# Patient Record
Sex: Male | Born: 1935 | Race: White | Hispanic: No | Smoking: Former smoker
Health system: Southern US, Community
[De-identification: ages and names within clinical notes are randomized; demographics above are authoritative.]

## PROBLEM LIST (undated history)

## (undated) DIAGNOSIS — E785 Hyperlipidemia, unspecified: Secondary | ICD-10-CM

## (undated) DIAGNOSIS — C61 Malignant neoplasm of prostate: Secondary | ICD-10-CM

## (undated) DIAGNOSIS — I714 Abdominal aortic aneurysm, without rupture, unspecified: Secondary | ICD-10-CM

## (undated) DIAGNOSIS — J449 Chronic obstructive pulmonary disease, unspecified: Secondary | ICD-10-CM

## (undated) DIAGNOSIS — I10 Essential (primary) hypertension: Secondary | ICD-10-CM

## (undated) HISTORY — DX: Hyperlipidemia, unspecified: E78.5

## (undated) HISTORY — DX: Essential (primary) hypertension: I10

## (undated) HISTORY — PX: TRANSURETHRAL RESECTION OF PROSTATE: SHX73

## (undated) HISTORY — DX: Abdominal aortic aneurysm, without rupture, unspecified: I71.40

## (undated) HISTORY — DX: Chronic obstructive pulmonary disease, unspecified: J44.9

## (undated) HISTORY — DX: Abdominal aortic aneurysm, without rupture: I71.4

---

## 1969-05-29 HISTORY — PX: STOMACH SURGERY: SHX791

## 1997-12-07 ENCOUNTER — Other Ambulatory Visit: Admission: RE | Admit: 1997-12-07 | Discharge: 1997-12-07 | Payer: Self-pay | Admitting: Family Medicine

## 2000-01-09 ENCOUNTER — Encounter: Payer: Self-pay | Admitting: Neurology

## 2000-01-09 ENCOUNTER — Encounter: Payer: Self-pay | Admitting: Emergency Medicine

## 2000-01-09 ENCOUNTER — Inpatient Hospital Stay (HOSPITAL_COMMUNITY): Admission: EM | Admit: 2000-01-09 | Discharge: 2000-01-11 | Payer: Self-pay | Admitting: Emergency Medicine

## 2000-11-06 ENCOUNTER — Encounter: Payer: Self-pay | Admitting: Gastroenterology

## 2000-11-06 ENCOUNTER — Encounter: Admission: RE | Admit: 2000-11-06 | Discharge: 2000-11-06 | Payer: Self-pay | Admitting: Gastroenterology

## 2000-12-04 ENCOUNTER — Ambulatory Visit (HOSPITAL_COMMUNITY): Admission: RE | Admit: 2000-12-04 | Discharge: 2000-12-04 | Payer: Self-pay | Admitting: Gastroenterology

## 2001-09-01 ENCOUNTER — Observation Stay (HOSPITAL_COMMUNITY): Admission: EM | Admit: 2001-09-01 | Discharge: 2001-09-02 | Payer: Self-pay | Admitting: Emergency Medicine

## 2001-09-01 ENCOUNTER — Encounter: Payer: Self-pay | Admitting: Emergency Medicine

## 2003-10-04 ENCOUNTER — Emergency Department (HOSPITAL_COMMUNITY): Admission: EM | Admit: 2003-10-04 | Discharge: 2003-10-05 | Payer: Self-pay | Admitting: Emergency Medicine

## 2004-01-11 ENCOUNTER — Encounter (INDEPENDENT_AMBULATORY_CARE_PROVIDER_SITE_OTHER): Payer: Self-pay | Admitting: Specialist

## 2004-01-11 ENCOUNTER — Ambulatory Visit (HOSPITAL_COMMUNITY): Admission: RE | Admit: 2004-01-11 | Discharge: 2004-01-11 | Payer: Self-pay | Admitting: Urology

## 2004-01-11 ENCOUNTER — Ambulatory Visit (HOSPITAL_BASED_OUTPATIENT_CLINIC_OR_DEPARTMENT_OTHER): Admission: RE | Admit: 2004-01-11 | Discharge: 2004-01-11 | Payer: Self-pay | Admitting: Urology

## 2004-03-01 ENCOUNTER — Ambulatory Visit (HOSPITAL_COMMUNITY): Admission: RE | Admit: 2004-03-01 | Discharge: 2004-03-01 | Payer: Self-pay | Admitting: General Surgery

## 2004-03-14 ENCOUNTER — Encounter (INDEPENDENT_AMBULATORY_CARE_PROVIDER_SITE_OTHER): Payer: Self-pay | Admitting: Specialist

## 2004-03-14 ENCOUNTER — Ambulatory Visit (HOSPITAL_COMMUNITY): Admission: RE | Admit: 2004-03-14 | Discharge: 2004-03-14 | Payer: Self-pay | Admitting: General Surgery

## 2004-03-16 ENCOUNTER — Encounter: Admission: RE | Admit: 2004-03-16 | Discharge: 2004-03-16 | Payer: Self-pay | Admitting: General Surgery

## 2004-08-29 ENCOUNTER — Emergency Department (HOSPITAL_COMMUNITY): Admission: EM | Admit: 2004-08-29 | Discharge: 2004-08-29 | Payer: Self-pay | Admitting: Emergency Medicine

## 2004-09-07 ENCOUNTER — Encounter: Admission: RE | Admit: 2004-09-07 | Discharge: 2004-09-07 | Payer: Self-pay | Admitting: Gastroenterology

## 2005-04-18 ENCOUNTER — Ambulatory Visit (HOSPITAL_COMMUNITY): Admission: RE | Admit: 2005-04-18 | Discharge: 2005-04-18 | Payer: Self-pay | Admitting: General Surgery

## 2005-06-21 IMAGING — US US BIOPSY
1 series · 13 of 14 positions shown · non-contrast
Comparison: none

CLINICAL DATA: Patient with history of hypercalcemia and thyroid ultrasound at [HOSPITAL] on 03/01/04 which revealed a multinodular thyroid gland with a 2.3 x 1.2 x 1.0 cm inhomogeneous solid nodule in the left mid thyroid lobe.  Request is made for fine-needle aspiration of this dominant left thyroid lobe nodule.

ULTRASOUND-GUIDED FINE NEEDLE ASPIRATION OF DOMINANT LEFT THYROID LOBE NODULE

[Series 1: unknown · 0.09mm/px · 13 of 14 slices shown]
[im 1/14]
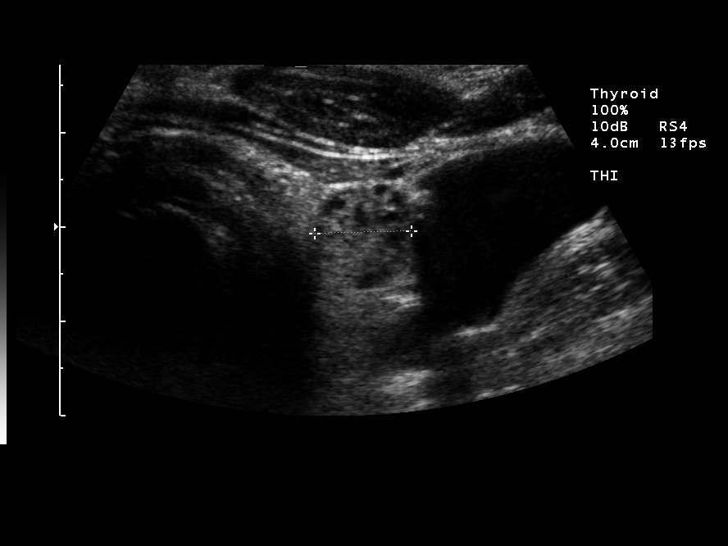
[im 2/14]
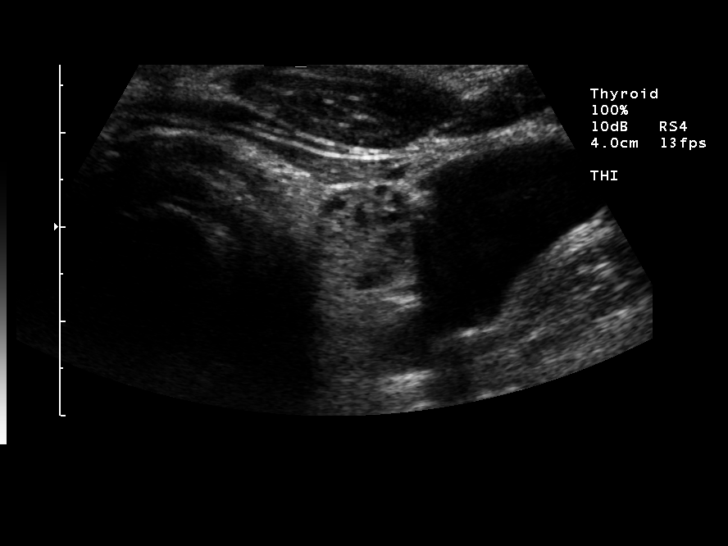
[im 3/14]
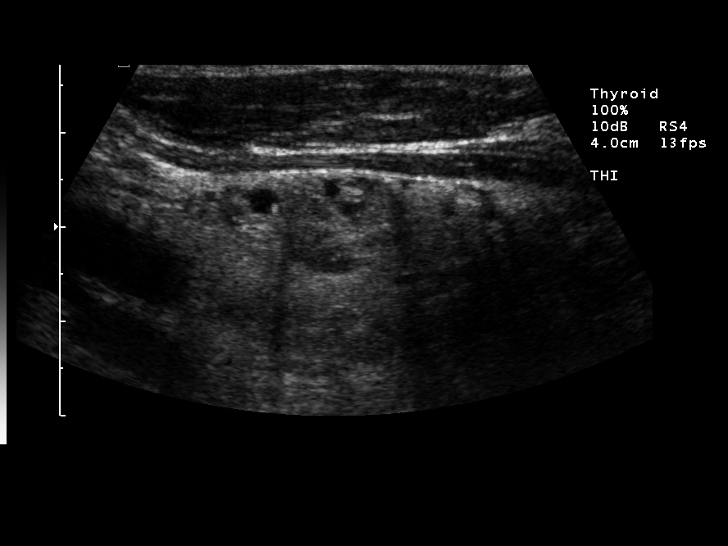
[im 4/14]
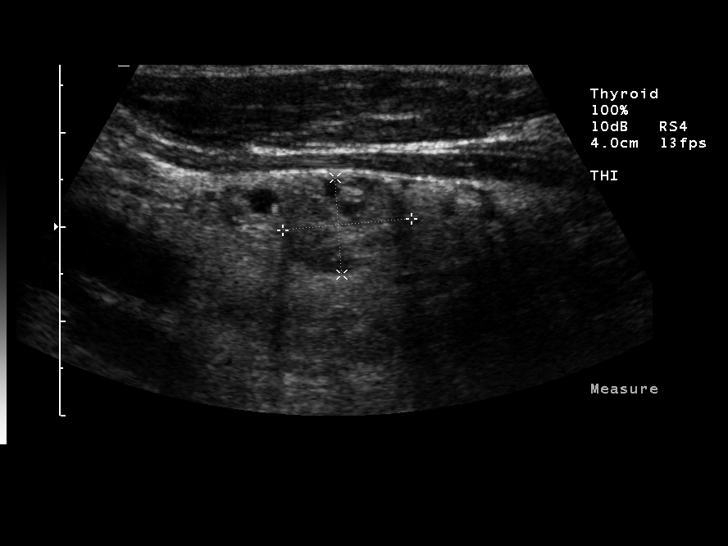
[im 5/14]
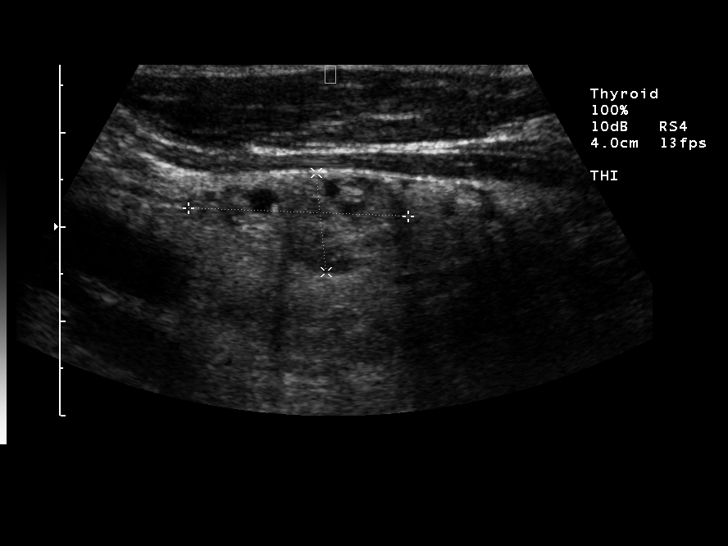
[im 6/14]
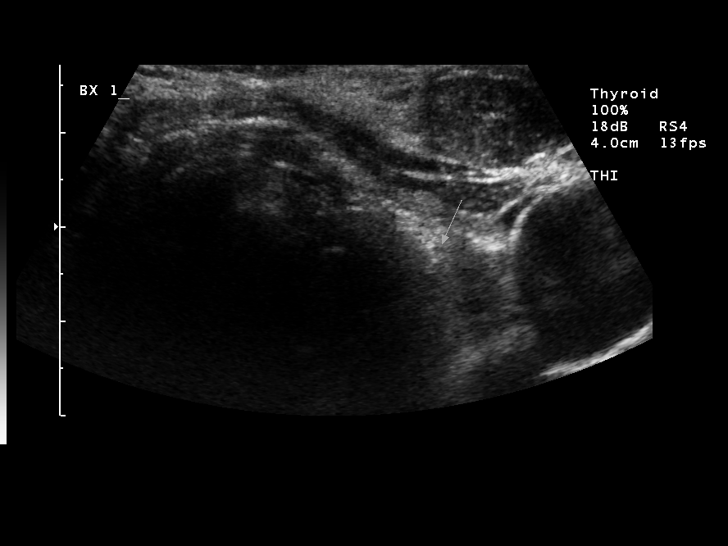
[im 8/14]
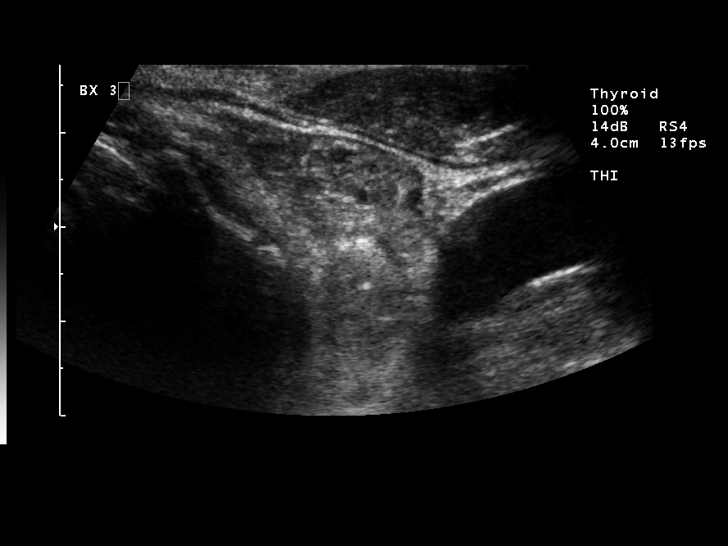
[im 9/14]
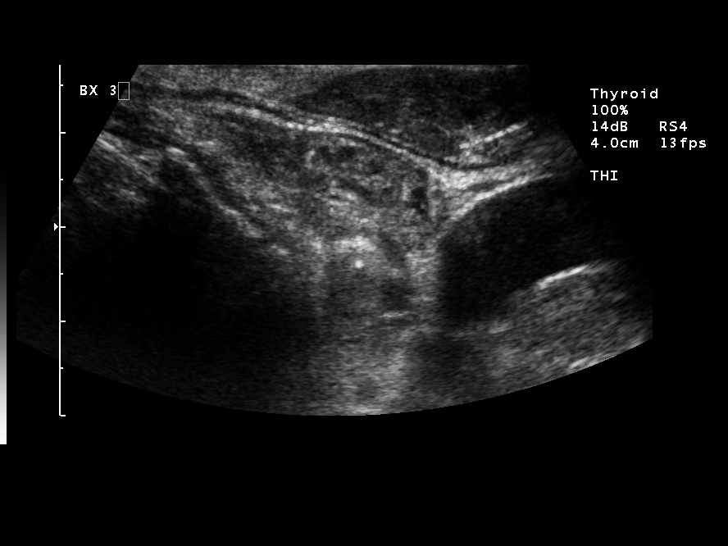
[im 10/14]
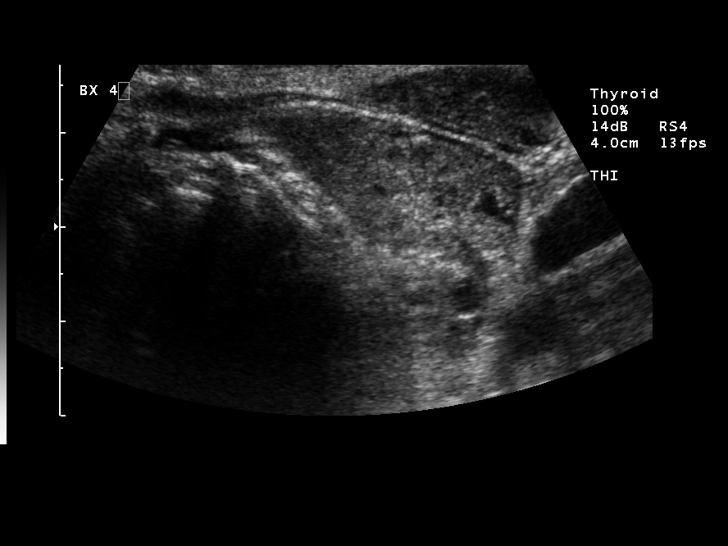
[im 11/14]
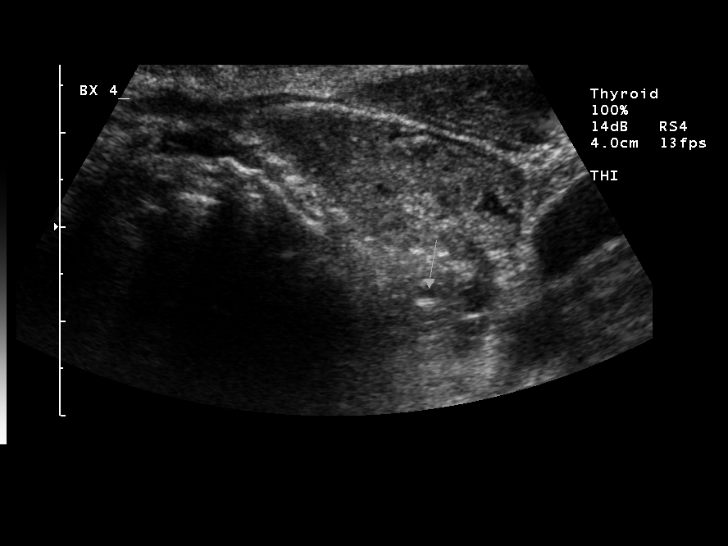
[im 12/14]
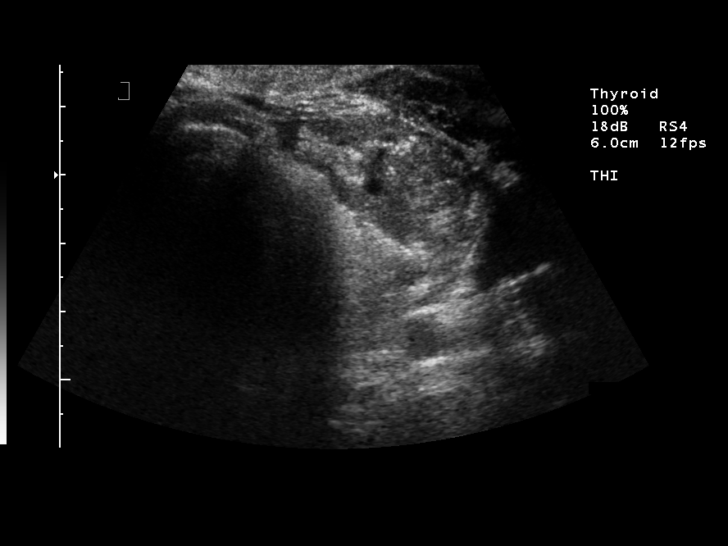
[im 13/14]
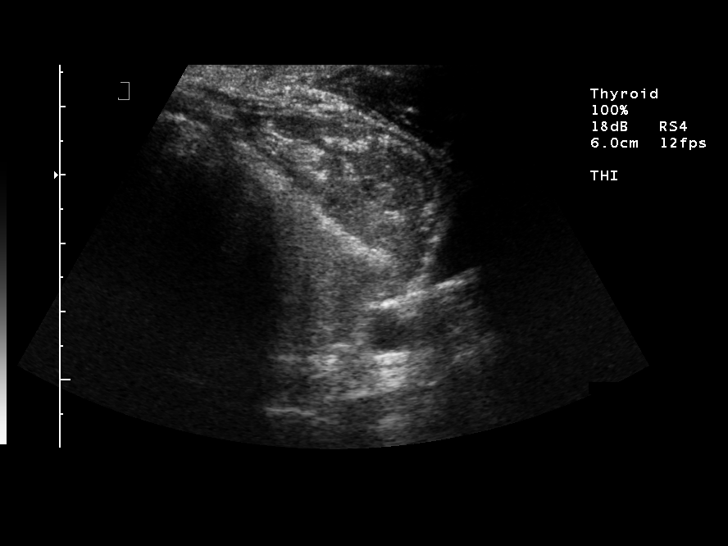
[im 14/14]
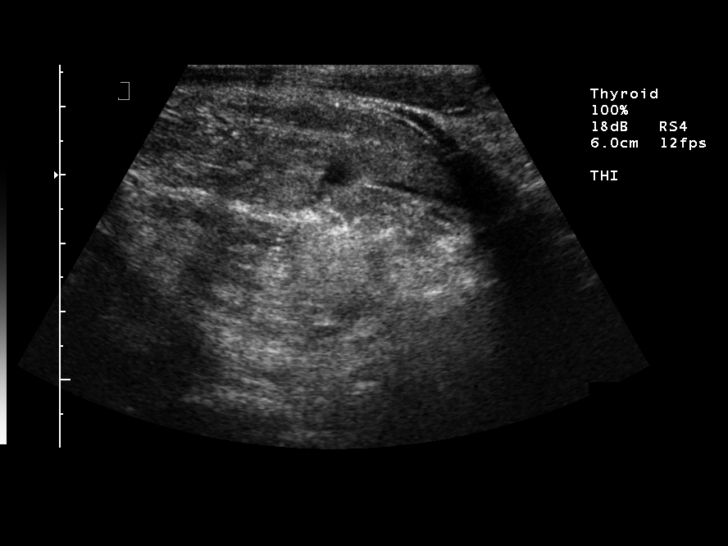

[13 of 14 positions shown; findings below may reference images not displayed]

FINDINGS: An ultrasound-guided thyroid biopsy was thoroughly discussed with the patient and questions  were answered.  Risks and benefits of the procedure were also delineated.   specifically discussed included bleeding, bruising, infection, and risk of injury to adjacent blood vessels or nerves.  The patient understands and wishes to proceed.  Verbal and written consent was obtained.
After the patient was prepped and draped in the normal sterile fashion, 1% lidocaine was used for local anesthesia.  Under direct ultrasound guidance, four passes were then made using 25-gauge hypodermic needles into the dominant left thyroid lobe nodule.  Ultrasound imaging confirmed appropriate needle placement in the nodule.  The specimens were given to cytology for further analysis.  The patient tolerated the procedure well.  A small superficial hematoma was noted postprocedure.  The patient was given an ice pack for the area and observed for an additional 15 minutes without any sequela.  
The procedure was performed under the personal supervision of Dr. Tor Moua.
IMPRESSION: Successful ultrasound-guided fine needle aspiration of dominant left mid thyroid lobe nodule as discussed above.

## 2005-07-10 ENCOUNTER — Ambulatory Visit (HOSPITAL_COMMUNITY): Admission: RE | Admit: 2005-07-10 | Discharge: 2005-07-10 | Payer: Self-pay | Admitting: Gastroenterology

## 2005-12-04 ENCOUNTER — Ambulatory Visit (HOSPITAL_COMMUNITY): Admission: RE | Admit: 2005-12-04 | Discharge: 2005-12-04 | Payer: Self-pay | Admitting: General Surgery

## 2005-12-18 ENCOUNTER — Encounter (INDEPENDENT_AMBULATORY_CARE_PROVIDER_SITE_OTHER): Payer: Self-pay | Admitting: Interventional Cardiology

## 2005-12-18 ENCOUNTER — Encounter: Payer: Self-pay | Admitting: Vascular Surgery

## 2005-12-18 ENCOUNTER — Inpatient Hospital Stay (HOSPITAL_COMMUNITY): Admission: EM | Admit: 2005-12-18 | Discharge: 2005-12-22 | Payer: Self-pay | Admitting: Emergency Medicine

## 2006-04-29 ENCOUNTER — Emergency Department (HOSPITAL_COMMUNITY): Admission: EM | Admit: 2006-04-29 | Discharge: 2006-04-29 | Payer: Self-pay | Admitting: Emergency Medicine

## 2006-07-18 ENCOUNTER — Ambulatory Visit (HOSPITAL_COMMUNITY): Admission: RE | Admit: 2006-07-18 | Discharge: 2006-07-18 | Payer: Self-pay | Admitting: General Surgery

## 2006-12-26 ENCOUNTER — Ambulatory Visit (HOSPITAL_COMMUNITY): Admission: RE | Admit: 2006-12-26 | Discharge: 2006-12-26 | Payer: Self-pay | Admitting: Gastroenterology

## 2007-09-03 ENCOUNTER — Encounter: Admission: RE | Admit: 2007-09-03 | Discharge: 2007-09-03 | Payer: Self-pay | Admitting: General Surgery

## 2007-10-23 ENCOUNTER — Ambulatory Visit (HOSPITAL_COMMUNITY): Admission: RE | Admit: 2007-10-23 | Discharge: 2007-10-23 | Payer: Self-pay | Admitting: Gastroenterology

## 2008-03-03 ENCOUNTER — Ambulatory Visit: Payer: Self-pay | Admitting: Vascular Surgery

## 2008-03-09 ENCOUNTER — Ambulatory Visit (HOSPITAL_COMMUNITY): Admission: RE | Admit: 2008-03-09 | Discharge: 2008-03-09 | Payer: Self-pay | Admitting: Vascular Surgery

## 2008-03-09 ENCOUNTER — Ambulatory Visit: Payer: Self-pay | Admitting: Vascular Surgery

## 2008-03-17 ENCOUNTER — Ambulatory Visit: Payer: Self-pay | Admitting: Vascular Surgery

## 2008-03-27 ENCOUNTER — Inpatient Hospital Stay (HOSPITAL_COMMUNITY): Admission: RE | Admit: 2008-03-27 | Discharge: 2008-03-29 | Payer: Self-pay | Admitting: Vascular Surgery

## 2008-03-27 HISTORY — PX: ENDOVASCULAR STENT INSERTION: SHX5161

## 2008-04-14 ENCOUNTER — Ambulatory Visit: Payer: Self-pay | Admitting: Vascular Surgery

## 2008-05-11 ENCOUNTER — Encounter: Admission: RE | Admit: 2008-05-11 | Discharge: 2008-05-11 | Payer: Self-pay | Admitting: Vascular Surgery

## 2008-11-10 ENCOUNTER — Ambulatory Visit: Payer: Self-pay | Admitting: Vascular Surgery

## 2008-11-10 ENCOUNTER — Encounter: Admission: RE | Admit: 2008-11-10 | Discharge: 2008-11-10 | Payer: Self-pay | Admitting: Vascular Surgery

## 2009-07-04 IMAGING — RF DG ABDOMEN 1V
1 series · 2 of 2 positions shown · non-contrast
Comparison: None available

CLINICAL DATA: Aortic stent graft placement

ABDOMEN - 1 VIEW

[Series 1: run · 2 of 2 slices shown]
[im 1/2]
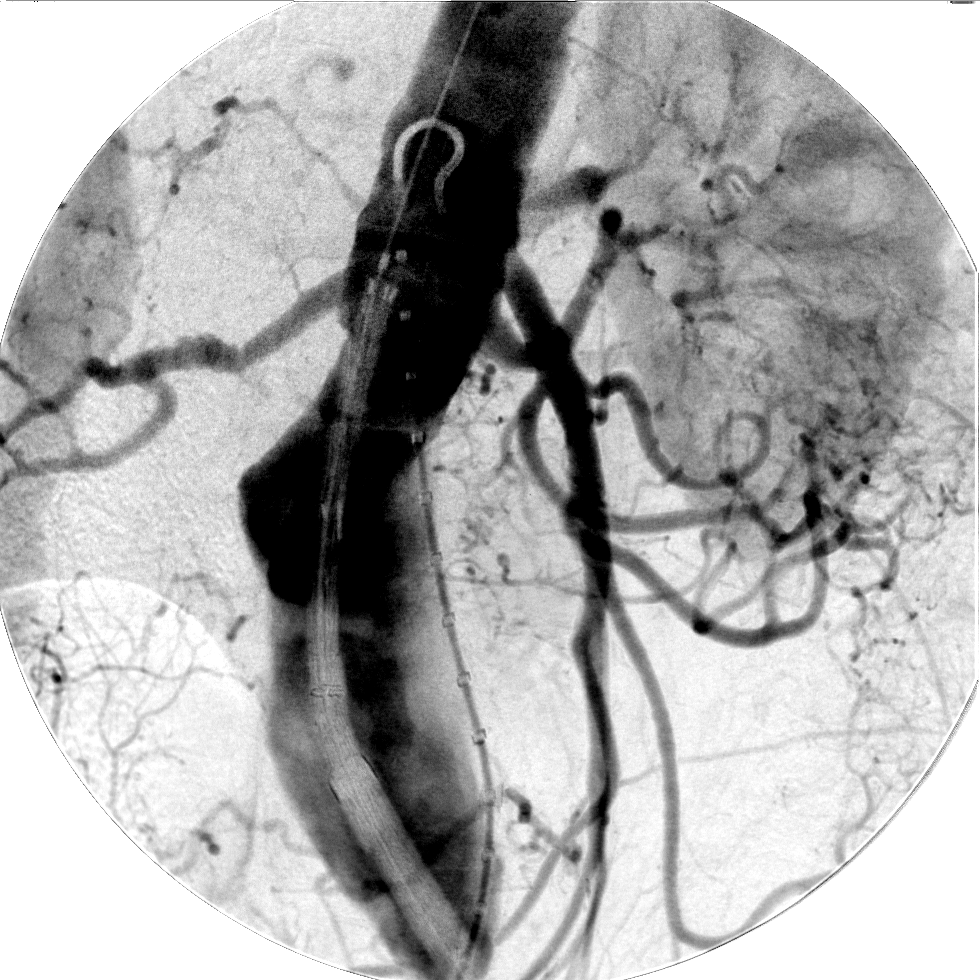
[im 2/2]
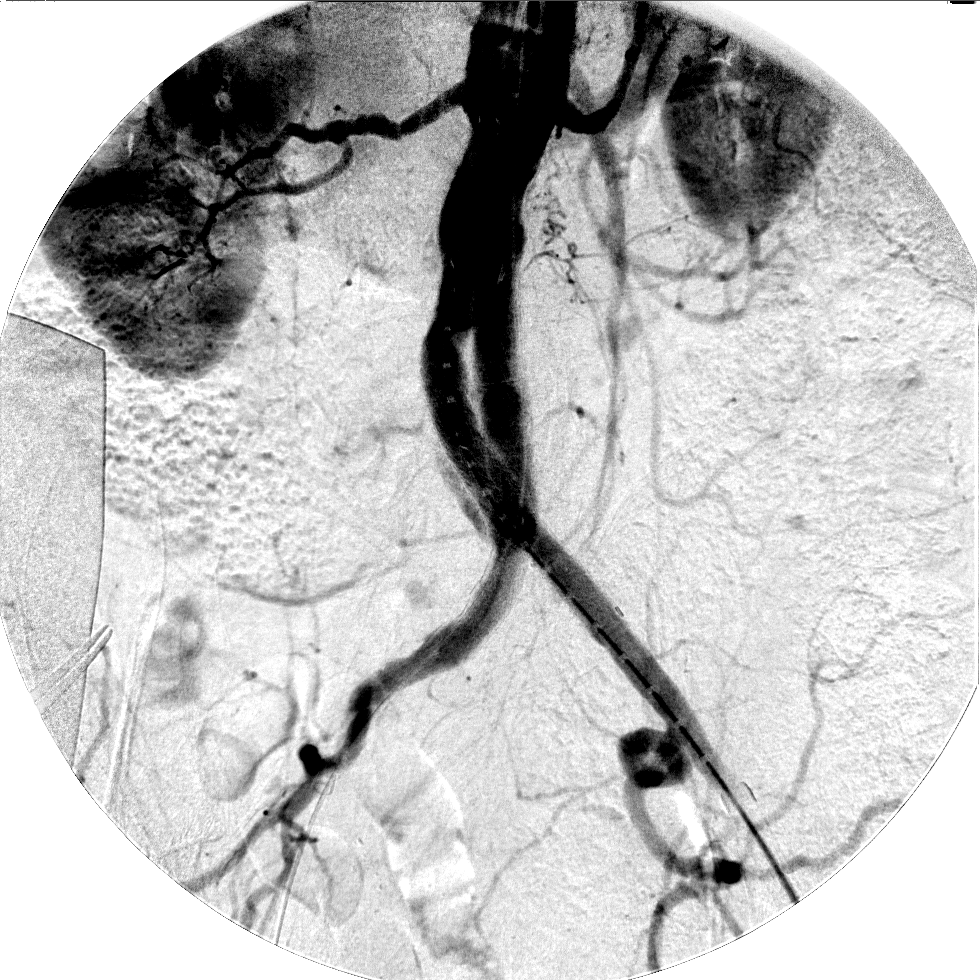

[2 of 2 positions shown; findings below may reference images not displayed]

FINDINGS: 2 subtracted fluoroscopic images from the OR demonstrate
interval placement of a bifurcated infrarenal aortic stent graft
which appears patent.  Bilateral single renal arteries are patent
on the final image.  Contrast projects adjacent to the left iliac
limb near the level of the aortic bifurcation suggesting possible
endoleak.
IMPRESSION: Infrarenal aortic stent graft placement as above

## 2009-07-05 IMAGING — CR DG CHEST 1V PORT
1 series · 1 of 1 positions shown · non-contrast
Comparison: 03/27/2008 and 03/24/2008.

CLINICAL DATA: Postop peripheral bypass.  Question pneumothorax.

PORTABLE CHEST - 1 VIEW

[AP]
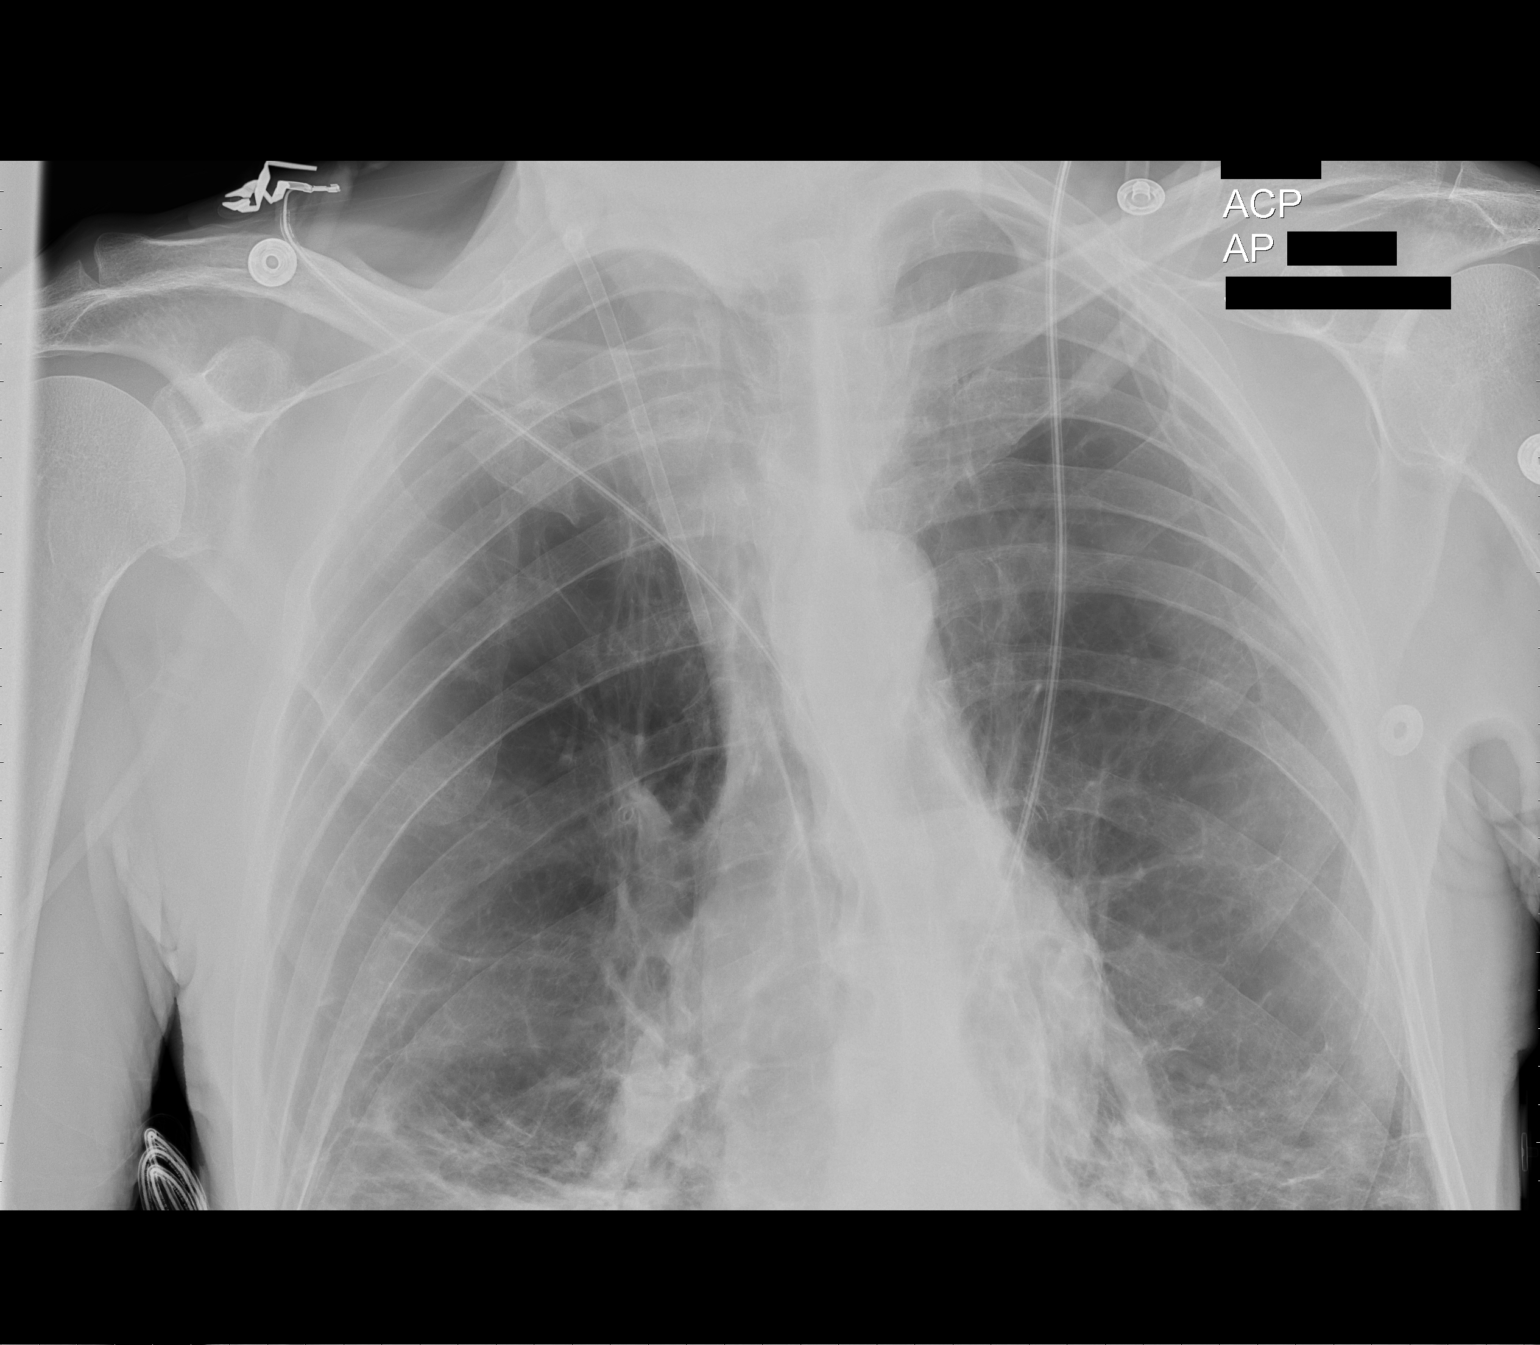

[1 of 1 positions shown; findings below may reference images not displayed]

FINDINGS: 5853 hours.  Right IJ sheath remains in place.  There is
severe emphysema with stable bibasilar atelectasis.  The questioned
pleural edge superiorly in the right hemithorax on the prior
examination is no longer visualized.  This was probably a skin
fold.  There is no evidence of pneumothorax or significant pleural
effusion.  The heart size and mediastinal contours appear stable.
IMPRESSION: 1.  Stable postoperative chest with bibasilar atelectasis
superimposed on severe emphysema.
2.  No evidence of pneumothorax.

## 2009-11-11 ENCOUNTER — Ambulatory Visit: Payer: Self-pay | Admitting: Vascular Surgery

## 2009-11-11 ENCOUNTER — Encounter: Admission: RE | Admit: 2009-11-11 | Discharge: 2009-11-11 | Payer: Self-pay | Admitting: Vascular Surgery

## 2010-03-18 ENCOUNTER — Ambulatory Visit (HOSPITAL_COMMUNITY): Admission: RE | Admit: 2010-03-18 | Discharge: 2010-03-18 | Payer: Self-pay | Admitting: Gastroenterology

## 2010-06-19 ENCOUNTER — Encounter: Payer: Self-pay | Admitting: Gastroenterology

## 2010-09-09 ENCOUNTER — Observation Stay (HOSPITAL_COMMUNITY)
Admission: EM | Admit: 2010-09-09 | Discharge: 2010-09-09 | Disposition: A | Discharge status: Home / Self Care | Payer: Medicare Other | Attending: Urology | Admitting: Urology

## 2010-09-09 DIAGNOSIS — I10 Essential (primary) hypertension: Secondary | ICD-10-CM | POA: Insufficient documentation

## 2010-09-09 DIAGNOSIS — N320 Bladder-neck obstruction: Principal | ICD-10-CM | POA: Insufficient documentation

## 2010-09-09 DIAGNOSIS — Z79899 Other long term (current) drug therapy: Secondary | ICD-10-CM | POA: Insufficient documentation

## 2010-09-09 DIAGNOSIS — E7800 Pure hypercholesterolemia, unspecified: Secondary | ICD-10-CM | POA: Insufficient documentation

## 2010-09-09 DIAGNOSIS — K219 Gastro-esophageal reflux disease without esophagitis: Secondary | ICD-10-CM | POA: Insufficient documentation

## 2010-09-09 DIAGNOSIS — J449 Chronic obstructive pulmonary disease, unspecified: Secondary | ICD-10-CM | POA: Insufficient documentation

## 2010-09-09 DIAGNOSIS — Z8673 Personal history of transient ischemic attack (TIA), and cerebral infarction without residual deficits: Secondary | ICD-10-CM | POA: Insufficient documentation

## 2010-09-09 DIAGNOSIS — J4489 Other specified chronic obstructive pulmonary disease: Secondary | ICD-10-CM | POA: Insufficient documentation

## 2010-09-09 DIAGNOSIS — Z01812 Encounter for preprocedural laboratory examination: Secondary | ICD-10-CM | POA: Insufficient documentation

## 2010-09-09 LAB — POCT I-STAT, CHEM 8
BUN: 33 mg/dL — ABNORMAL HIGH (ref 6–23)
Calcium, Ion: 1.2 mmol/L (ref 1.12–1.32)
Chloride: 109 mEq/L (ref 96–112)
Glucose, Bld: 83 mg/dL (ref 70–99)
TCO2: 21 mmol/L (ref 0–100)

## 2010-09-13 ENCOUNTER — Ambulatory Visit (HOSPITAL_BASED_OUTPATIENT_CLINIC_OR_DEPARTMENT_OTHER): Admission: RE | Admit: 2010-09-13 | Payer: Medicare Other | Source: Ambulatory Visit | Admitting: Urology

## 2010-09-15 NOTE — Op Note (Signed)
Bill [name], PETTIS               ACCOUNT NO.:  [id number]  MEDICAL RECORD NO.:  [id number]           PATIENT TYPE:  O  LOCATION:  0001                         FACILITY:  [facility]  PHYSICIAN:  Excell Seltzer. Annabell Howells, M.D.    DATE OF BIRTH:  [birth year]  DATE OF PROCEDURE:  09/09/2010 DATE OF DISCHARGE:                              OPERATIVE REPORT   PROCEDURES: 1. Cystoscopy. 2. Incision of bladder neck contracture.  PREOPERATIVE DIAGNOSIS:  Recurrent bladder neck contracture.  POSTOPERATIVE DIAGNOSIS:  Recurrent bladder neck contracture.  SURGEON:  Excell Seltzer. Annabell Howells, M.D.  ANESTHESIA:  General.  SPECIMEN:  None.  DRAINS:  A 22-French Foley catheter.  COMPLICATIONS:  None.  INDICATIONS:  Mr. Twilley is a 77 year old white male 2 years out from radical prostatectomy in [place].  He has had recurrent bladder neck contractures with 4 prior interventions.  He had seen Dr. Aldean Ast on September 07, 2010, and was to return for bladder neck incision but developed progressive voiding difficulty today and came to the [facility] Emergency Room where he was unable to void.  There were unable to place Foley.  It was felt that urgent endoscopy with bladder neck incision was indicated.  FINDINGS OF PROCEDURE:  He was given informed consent and taken to the operating room where was given Cipro 400 mg and Unasyn 3 g IV.  A general anesthetic was induced.  He was placed in lithotomy position. His perineum and genitalia were prepped with Betadine solution and he was draped in usual sterile fashion.  Cystoscopy was performed using a 22-French scope and 12-degree lens. Examination revealed a normal urethra.  The area of the external sphincter demonstrated obliterated stricture.  It was difficult to differentiate sphincter from the stricture.  Fortunately, a sensor guidewire passed easily into the bladder through the urethral lumen and I was able to get a 24-French 15-cm balloon across the bladder  neck contraction.  The balloon was dilated to 20 atmospheres and removed.  The cystoscope was then reinserted. Examination revealed disruption of the stricture.  There was some dystrophic calcification.  Examination of bladder revealed turbid urine which was drained.  The bladder was refilled.  The bladder wall was smooth and pale without tumor, stones or inflammation.  Ureteral orifices were unremarkable.  At this point, a 28-French continuous flow resectoscope sheath was inserted.  This was fitted with 12-degree lens and Wandra Scot handle and a [employer].  I was unable to get the tip of the scope all the way in through the stricture but I was able to make one lateral incision on the right to help widen the bladder neck contracture.  I did not feel comfortably being more aggressive since I  could not get the scope fully in and I did not want to further disrupt the patient's sphincter as incontinence is likely to be significant.  At this point, the scope was removed and a 22-French Foley catheter was inserted.  The wound was filled with 10 cc of sterile fluid.  The catheter was placed to straight drainage.  The patient was taken down from lithotomy position.  His anesthetic was  reversed.  He was taken to recovery room in stable condition.  There were no complications.  He will be sent home with prescription for Cipro and Vicodin and instructions to follow up with Dr. Aldean Ast for consideration of catheter removal in 2 to 3 weeks.     Excell Seltzer. Annabell Howells, M.D.     JJW/MEDQ  D:  09/09/2010  T:  09/10/2010  Job:  [id number]  cc:   Courtney Paris, M.D. Fax: [phone]  Electronically Signed by Bjorn Pippin M.D. on 09/15/2010 02:16:40 PM

## 2010-10-08 ENCOUNTER — Emergency Department (HOSPITAL_COMMUNITY)
Admission: EM | Admit: 2010-10-08 | Discharge: 2010-10-08 | Disposition: A | Discharge status: Home / Self Care | Payer: Medicare Other | Attending: Emergency Medicine | Admitting: Emergency Medicine

## 2010-10-08 DIAGNOSIS — I10 Essential (primary) hypertension: Secondary | ICD-10-CM | POA: Insufficient documentation

## 2010-10-08 DIAGNOSIS — Z8546 Personal history of malignant neoplasm of prostate: Secondary | ICD-10-CM | POA: Insufficient documentation

## 2010-10-08 DIAGNOSIS — R339 Retention of urine, unspecified: Secondary | ICD-10-CM | POA: Insufficient documentation

## 2010-10-08 LAB — URINALYSIS, ROUTINE W REFLEX MICROSCOPIC
Bilirubin Urine: NEGATIVE
Ketones, ur: NEGATIVE mg/dL
Nitrite: NEGATIVE
Protein, ur: NEGATIVE mg/dL
Urobilinogen, UA: 0.2 mg/dL (ref 0.0–1.0)
pH: 5.5 (ref 5.0–8.0)

## 2010-10-08 LAB — CBC
MCH: 30.8 pg (ref 26.0–34.0)
MCHC: 33.5 g/dL (ref 30.0–36.0)
MCV: 91.9 fL (ref 78.0–100.0)
Platelets: 267 10*3/uL (ref 150–400)
RDW: 13.4 % (ref 11.5–15.5)

## 2010-10-08 LAB — DIFFERENTIAL
Basophils Relative: 2 % — ABNORMAL HIGH (ref 0–1)
Eosinophils Absolute: 0.3 10*3/uL (ref 0.0–0.7)
Eosinophils Relative: 4 % (ref 0–5)
Lymphs Abs: 1.5 10*3/uL (ref 0.7–4.0)
Monocytes Absolute: 0.8 10*3/uL (ref 0.1–1.0)
Monocytes Relative: 10 % (ref 3–12)
Neutrophils Relative %: 66 % (ref 43–77)

## 2010-10-08 LAB — BASIC METABOLIC PANEL
BUN: 21 mg/dL (ref 6–23)
Creatinine, Ser: 1.77 mg/dL — ABNORMAL HIGH (ref 0.4–1.5)
GFR calc non Af Amer: 37 mL/min — ABNORMAL LOW (ref >60)
Glucose, Bld: 88 mg/dL (ref 70–99)
Potassium: 4.3 mEq/L (ref 3.5–5.1)

## 2010-10-09 LAB — URINE CULTURE: Colony Count: NO GROWTH

## 2010-10-11 NOTE — Op Note (Signed)
[name], [name]               ACCOUNT NO.:  [id number]   MEDICAL RECORD NO.:  [id number]          PATIENT TYPE:  AMB   LOCATION:  SDS                          FACILITY:  [facility]   PHYSICIAN:  Di Kindle. Edilia Bo, M.D.DATE OF BIRTH:  December 23, 1930   DATE OF PROCEDURE:  03/09/2008  DATE OF DISCHARGE:                               OPERATIVE REPORT   PREOPERATIVE DIAGNOSIS:  A 6.1-cm infrarenal abdominal aortic aneurysm.   POSTOPERATIVE DIAGNOSIS:  A 6.1-cm infrarenal abdominal aortic aneurysm.   PROCEDURES:  1. Ultrasound-guided access to the right common femoral artery.  2. Aortogram with bilateral runoff.   SURGEON:  Di Kindle. Edilia Bo, MD   ANESTHESIA:  Local.   INDICATIONS:  This is a 77 year old gentleman who had undergone previous  TURP.  On a followup CT scan, he was noted have a 6.1-cm infrarenal  abdominal aortic aneurysm.  He was sent for vascular consultation.  Given the size of the aneurysm, elective repair was recommended in order  to prevent rupture.  He is brought in for diagnostic arteriogram as a  part of his preoperative planning.  The procedure and potential  complications including but not limited to bleeding, arterial injury,  and kidney failure were discussed with the patient prior to the  procedure, all questions were answered, and he was agreeable to proceed.   TECHNIQUE:  The patient was taken to the PV lab at [facility].  Both groins  were prepped and draped in usual sterile fashion.  After the skin was  anesthetized with 1% lidocaine and under ultrasound guidance, the right  common femoral artery was cannulated and a guidewire introduced into the  infrarenal aorta under fluoroscopic control.  A 5-French sheath was  introduced over the wire.  A pigtail catheter was positioned at the L1  vertebral body.  Flush aortogram obtained and then a lateral projection  was obtained.  The catheter was then brought above the aortic  bifurcation and oblique iliac  projections were obtained.  Bilateral  lower extremity runoff films were then obtained.   FINDINGS:  There are single renal arteries bilaterally with no  significant renal artery stenosis identified.  The celiac axis and SMA  are widely patent.  The size of the aneurysm cannot be actively  determined by this study.  There appears to be approximately 2 cm neck  and there is angulation of the neck probably between 45-60 degrees.  The  IMA is not visualized.  The common iliacs, external iliacs, and  hypogastric arteries are patent bilaterally.  Common  femoral, superficial femoral, deep femoral, popliteal, and tibial  vessels were all patent bilaterally.  There is mild tibial occlusive  disease distally.   CONCLUSION:  Infrarenal abdominal aortic aneurysm.  No significant  occlusive disease.      Di Kindle. Edilia Bo, M.D.  Electronically Signed     CSD/MEDQ  D:  03/09/2008  T:  03/09/2008  Job:  [id number]   cc:   Dr. [place]  Dr. Salvatore Decent  Dr. Yetta Flock

## 2010-10-11 NOTE — Assessment & Plan Note (Signed)
OFFICE VISIT   Bill [name], Bill [name]  DOB:  [birth year]                                       11/10/2008  AOZHY#:[id number]   I saw the patient in the office today for continued followup after  endovascular repair of his 6.1 cm infrarenal abdominal aortic aneurysm.  This is a pleasant 77 year old gentleman who underwent a CT scan as part  of a workup for previous prostatectomy.  He was found to have a 6.1 cm  infrarenal aneurysm.  He underwent endovascular repair of his aneurysm  on 03/27/2008 and he returns for a 6 month followup CT scan.  Since I  saw him last his only complaint has been that he has been losing weight  but he has had no abdominal or back pain.   REVIEW OF SYSTEMS:  He has had no chest pain, chest pressure,  palpitations or arrhythmias.  He has had no productive cough,  bronchitis, asthma or wheezing.   PHYSICAL EXAMINATION:  This is a pleasant 77 year old gentleman who  appears his stated age.  Blood pressure is 167/72, heart rate is 69.  Lungs are clear bilaterally to auscultation.  On cardiac exam he has a  regular rate and rhythm.  Abdomen is soft and nontender.  His groin  incisions have healed nicely.  He has a small lymphocele in the left  groin.  There is no evidence of atheroembolic disease.   I reviewed his CT scan and this shows good position of his graft with no  evidence of endoleak.  The aneurysm has shrunk to 5.7 cm in maximum  diameter.   Overall I am pleased with progress and we will arrange for a followup CT  scan in 1 year.  He knows to call sooner if he has problems.   Di Kindle. Edilia Bo, M.D.  Electronically Signed   CSD/MEDQ  D:  11/10/2008  T:  11/11/2008  Job:  [id number]   cc:   Dr Yetta Flock  Dr Granville Niccolas

## 2010-10-11 NOTE — Op Note (Signed)
NAMEGLENDEL, JAGGERS NO.:  [id number]   MEDICAL RECORD NO.:  [id number]          PATIENT TYPE:  INP   LOCATION:  3308                         FACILITY:  [facility]   PHYSICIAN:  Di Kindle. Edilia Bo, M.D.DATE OF BIRTH:  10-25-1930   DATE OF PROCEDURE:  03/27/2008  DATE OF DISCHARGE:                               OPERATIVE REPORT   PREOPERATIVE DIAGNOSIS:  Abdominal aortic aneurysm.   POSTOPERATIVE DIAGNOSIS:  Abdominal aortic aneurysm.   PROCEDURES:  Endovascular repair of abdominal aortic aneurysm using a  Gore graft.   INDICATIONS:  This is a 77 year old gentleman who had undergone a CT  scan as part of the workup for his previous prostatectomy.  This was a  routine followup scan and an incisional find was a 6.1-cm aneurysm.  Given the risk of rupture, repair was recommended and after preoperative  evaluation, he was felt to be a candidate for endovascular repair.  Open  versus endovascular repair was discussed with the patient and ultimately  he elected to proceed with endovascular repair.   TECHNIQUE:  The patient was taken to the operating room and received a  general anesthetic.  The abdomen and groins were prepped and draped in  the usual sterile fashion.  Using a 2-team approach, Dr. Manson Passey exposed  the right femoral artery and this is dictated separately.  I exposed the  left common femoral artery and a 5-French sheath was introduced over a  wire in the left groin and a sheath was introduced in the right groin,  and the patient was then heparinized.  A pigtail catheter was introduced  in the right side and next, the 18-French Gore sheath was advanced over  the Amplatz wire through the left femoral artery.  The sheath was  advanced and then trunk-ipsilateral leg endoprosthesis was advanced  through the left sheath with the contralateral limb position on the  patient's right, thus intentionally crossing the limbs to allow easier  cannulation of the gate.   Next, an aortogram was obtained to check  position and the catheter was positioned just below the level of the  renal arteries.  The sheath had been retracted and the graft was  partially deployed.  At that point, the mean arterial pressure was  approximately 70.  The device was deployed enough that the contralateral  gate was then opened.  Next, through the right femoral approach using an  angled Glidewire and a Kumpe  catheter, the contralateral limb was  cannulated and a wire was advanced into the graft.  A catheter was then  advanced into the graft and twisted to be sure that it was within the  lumen of the graft.  Once this was confirmed, the Amplatz wire was  advanced through the catheter and then the contralateral leg, which was  a 12 x 12 device, was advanced with a 3-cm overlap into the  contralateral limb of the main endoprosthesis.  The contralateral leg  was then deployed after initially a sheath injection was made on the  right to check position of the hypogastric artery and we were above this  level.  Once the contralateral leg was deployed, the trunk ipsilateral  endoprosthesis was fully deployed and then an iliac extender was placed  by advancing this extender within the main device with an overlap of 3  cm and this was then deployed down into the left common iliac artery.  Again, before fully deploying the device, a sheath injection was made on  the left to confirm position of the hypogastric artery.  We then  ballooned the proximal graft and all the junction points and a  completion film was then obtained which showed no evidence of type 1  endoleak.  There appeared to be a good seal both proximally and  distally.  At the completion, the heparin was partially reversed with 30  mg of protamine.  The groin incisions were closed with deep layer of 3-0  Vicryl, the subcutaneous layer with 3-0 Vicryl, and the skin was closed  with 4-0 subcuticular stitch.  Sterile dressing   was applied.  The patient tolerated the procedure well and was  transferred to the recovery room in satisfactory condition.  All needle  and sponge counts were correct.  The patient had palpable pedal pulses  at the completion of the procedure.      Di Kindle. Edilia Bo, M.D.  Electronically Signed     CSD/MEDQ  D:  03/27/2008  T:  03/27/2008  Job:  [id number]   cc:   [facility]  Tommye Standard, MD  Dr. Yetta Flock

## 2010-10-11 NOTE — H&P (Signed)
HISTORY AND PHYSICAL EXAMINATION   March 17, 2008   Re:  Bill [name], SEVERE               DOB:  [birth year]   REASON FOR ADMISSION:  Infrarenal abdominal aortic aneurysm.   HISTORY:  This is a pleasant 77 year old gentleman whom I had seen in  consultation on March 03, 2008 with a 6.1-cm infrarenal abdominal  aortic aneurysm.  He had been referred by his urologist, Dr. Salvatore Decent.  The patient had undergone a previous prostatectomy and on routine  followup CT scan done on 02/25/2008, it was noted that he had a 6.1-cm  aneurysm.  He was sent for vascular consultation.  Of note, he had no  symptoms related to this.  Specifically he had no abdominal or back pain  or evidence of atheroembolic disease.   PAST MEDICAL HISTORY:  Significant for hypertension and slightly  elevated cholesterol.  In addition he has a history of emphysema.  He  denies any history of diabetes, history of previous myocardial  infarction, history of congestive heart failure.   PAST SURGICAL HISTORY:  He had a previous TURP.  In addition he had  gastric surgery for peptic ulcer disease in 1971.   FAMILY HISTORY:  He is unaware of any history of aneurysmal disease.  There is no history of premature cardiovascular disease.  His mother did  have an intracranial aneurysm.   SOCIAL HISTORY:  He is widowed.  He has 2 children.  Quit tobacco in  1997.   ALLERGIES:  No known drug allergies.   MEDICATIONS:  1. Flomax 0.4 mg p.o. daily.  2. Lisinopril 20 mg p.o. daily.  3. Pantoprazole 40 mg p.o. daily.  4. Propranolol 20 mg 2 a day.  5. Trazodone 100 mg p.o. daily.  6. Verapamil 240 mg p.o. daily.   REVIEW OF SYSTEMS:  GENERAL:  He has lost about 30 pounds over the last  2 years.  He had no change really in his appetite, fever or chills.  CARDIAC:  He has had no chest pain, chest pressure, palpitations or  arrhythmias.  PULMONARY:  He has had no recent productive cough, bronchitis, asthma or  wheezing.  GI:  He has had no recent changes bowel habits.  He does have a history  of hiatal hernia and occasional problems swallowing.  He has had  previous gastric surgery.  GU: He has had no dysuria or frequency.  VASCULAR:  He has had no claudication or rest pain.  He states he has  had a mini stroke in the past but cannot remember the details.  He has  had no history of DVT or phlebitis.  NEURO:  He has occasional headaches.  He had no dizziness, blackouts or  seizures.  ORTHO/SKIN:  He has had no arthritis, joint pain, muscle pain or rash.  PSYCH:  He has had no depression or nervousness.  ENT:  He has had no recent changes eyesight or change in hearing.  HEMATOLOGIC:  He has had no bleeding problems or clotting disorders.   PHYSICAL EXAMINATION:  This is a pleasant 77 year old gentleman who  appears his stated age.  His blood pressure is 138/74, heart rate is 59.  Respiratory rate 18.  Neck is supple.  No cervical lymphadenopathy.  I  do not detect any carotid bruits.  Lungs:  Clear bilaterally to  auscultation.  Cardiac exam, he has a regular rate and rhythm.  Abdomen:  Soft, nontender.  Aneurysm is palpable  and nontender.  He has normal  pitched bowel sounds.  I cannot appreciate any abdominal bruits.  Palpable femoral pulses.  Small hematoma in the right groin from his  calf.  Both feet are warm well-perfused without ischemic ulcers.  He has  no evidence of atheroembolic disease.  He has palpable pedal pulses.  He  has no significant lower extremity swelling.  Neurologic:  Exam is  nonfocal.   I did review his CT scan which, demonstrates an infrarenal abdominal  aortic aneurysm measuring 6.1 cm in maximum diameter.  He has also had  an arteriogram.  Based on review his CT scan and arteriogram he appears  to be reasonable candidate for endovascular stent graft.  There is some  angulation of the neck but it is less than 60 degrees and he has a  reasonable neck at 2 cm in  length.  The aortic bifurcation is somewhat  small but it appears that he is a candidate for a stent graft.  I have  reviewed these films with Trena Platt at [place].   The patient has had a Cardiolite by [facility] which showed  normal adenosine Cardiolite study and normal LV function.  Ejection  fraction was estimated at 64%.   This patient presents with a 6.1 cm abdominal aneurysm.  I have  explained the risk of rupture is 5 to 10% per year.  We have discussed  open versus endovascular repair.  He understands the potential  disadvantages of endovascular repair is the need for lifelong follow up  including the risk of endoleak, aneurysm expansion, and the need for  conversion to open repair.  We have also discussed the option of open  repair the potential complications associated with this.  The patient  would prefer endovascular repair and, given his age, COPD, and previous  abdominal surgery, this is what I would recommend.  Will schedule his  surgery hopefully within the next 2 weeks.   Di Kindle. Edilia Bo, M.D.  Electronically Signed   CSD/MEDQ  D:  03/17/2008  T:  03/18/2008  Job:  [id number]   cc:   Dr. Salvatore Decent  Dr. Yetta Flock

## 2010-10-11 NOTE — Op Note (Signed)
Bill [name], Bill [name]               ACCOUNT NO.:  [id number]   MEDICAL RECORD NO.:  [id number]          PATIENT TYPE:  AMB   LOCATION:  ENDO                         FACILITY[facility]  [facility]   PHYSICIAN:  James L. Malon Kindle., M.D.DATE OF BIRTH:  1931/01/16   DATE OF PROCEDURE:  10/23/2007  DATE OF DISCHARGE:                               OPERATIVE REPORT   PROCEDURE:  Esophagogastroduodenoscopy with dilatation using  fluoroscopic guidance.   MEDICATIONS:  Cetacaine spray 50 mcg, Versed 5 mg IV, __________ .   __________ requested additional dilatation.  __________  benefits and  risks of the procedure have been discussed with him again in the office.   DESCRIPTION OF PROCEDURE:  Procedure was performed in the endoscopy unit  using fluoroscopic guidance.  The patient was sedated.  The Pentax upper  scope was inserted.  The patient had a hiatal hernia and minimal  stricture.  The scope easily passed.  Complete endoscopy was performed.  The duodenum and the stomach were seen well.  Marked erosive gastritis  of the antrum.  The Savary guide wire was placed through the scope and  the scope withdrawn over the guide wire.  With the patient's neck  extended, we then passed the 15, 16, and 17 mm __________  with a small  amount of __________  at 17, and the guide wire and dilator were  withdrawn __________ .  The scope was withdrawn.  There were no  immediate complications.   ASSESSMENT:  1. 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  2. Erosive gastritis.   __________ see him back in my office in four to six weeks.  __________  clear liquids __________  4 to 6 hours __________ .           ______________________________  Llana Aliment. Malon Kindle., M.D.     Waldron Session  D:  10/23/2007  T:  10/23/2007  Job:  [id number]   cc:   Dr. Charlott Rakes

## 2010-10-11 NOTE — Consult Note (Signed)
VASCULAR SURGERY CONSULTATION   Bill [name], Bill [name]  DOB:  1931-04-26                                       03/03/2008  ONGEX#:[id number]   I saw the patient in the office today in consultation concerning a 6.1  cm infrarenal abdominal aortic aneurysm.  He was referred by his  urologist, Dr. Salvatore Decent.  This is a pleasant 77 year old gentleman who  had undergone a prostatectomy.  On a routine followup CT scan which was  done on 02/25/2008 it was noted that he had an infrarenal abdominal  aortic aneurysm measuring 6.1 cm in greatest diameter.  He was referred  for vascular consultation.  Of note, he has had no significant abdominal  or back pain.  He has no family history of abdominal aortic aneurysm  that he is aware of.  His mother did have an intracranial aneurysm.   PAST MEDICAL HISTORY:  Past medical history is significant for  hypertension and slightly elevated cholesterol.  In addition, he has a  history of emphysema.  He denies any history of diabetes, history of  previous myocardial infarction, history of congestive heart failure.   PAST SURGICAL HISTORY:  Significant for prostate surgery which he  believes was a TURP.  In addition, he had gastric surgery for peptic  ulcer disease in 1971.   FAMILY HISTORY:  He is unaware of any history of premature  cardiovascular disease.   SOCIAL HISTORY:  He is widowed.  He has two children.  He quit tobacco  in 1997.   MEDICATIONS AND REVIEW OF SYSTEMS:  Are documented on the medical  history form in his chart.  On review of systems he does have a 30 pound  weight loss over the last 2 years.  He is 130 pounds.  He has a history  of a hiatal hernia with some occasional problems swallowing and has had  previous esophageal dilatations.  He also has occasional headaches.   PHYSICAL EXAMINATION:  General:  This is a pleasant 78 year old  gentleman who appears his stated age.  Vital signs:  Blood pressure is  200/90,  heart rate is 93.  Neck:  Is supple.  There is no cervical  lymphadenopathy.  I do not detect any carotid bruits.  Lungs:  Are clear  bilaterally to auscultation.  Cardiac:  He has a regular rate and  rhythm.  Abdomen:  Soft and nontender.  His aneurysm is palpable and  nontender.  He has normal pitched bowel sounds.  I cannot appreciate any  abdominal bruits.  He has palpable femoral pulses.  Both feet are warm  and well-perfused without ischemic ulcers.  He has no evidence of  atheroembolic disease.  He has palpable pedal pulses.  No significant  lower extremity swelling.  Neurological:  Exam is nonfocal.   I do have a CT scan report but do not have the actual films.  This  demonstrates an infrarenal abdominal aortic aneurysm measuring 6.1 cm in  maximum diameter.  He has a nonspecific anterior pancreatic cystic  lesion which has not progressed compared to the scan back in June of  2008.  He has evidence of bilateral renal cysts which have not  progressed compared to his previous study.  He has chronic nonspecific  prostate enlargement.  He has a chronic tiny right and 3-4 mm left renal  calculus  which is nonobstructing.   I have explained that we generally would consider elective repair for an  aneurysm in a normal risk patient of 5.5 cm.  Given his aneurysm is 6.1  cm I would recommend elective repair.  I have explained that the risk of  rupture for an aneurysm of this size is 5-10% per year.  We have  discussed both open repair and endovascular repair and the advantages  and disadvantages to each.  We will try to obtain his CT scan from  [facility] so I can review these films.  In addition, he is  scheduled for an arteriogram to further evaluate the aneurysm and to  determine if he is a candidate for endovascular repair.  We will also  schedule a Cardiolite.  Once his workup is complete we will plan  elective repair of his aneurysm.   Di Kindle. Edilia Bo, M.D.   Electronically Signed  CSD/MEDQ  D:  03/03/2008  T:  03/04/2008  Job:  [id number]   cc:   D Caberwal  Dr Aurora Mask

## 2010-10-11 NOTE — Assessment & Plan Note (Signed)
OFFICE VISIT   NORIEL, GUTHRIE  DOB:  [birth year]                                       04/14/2008  EAVWU#:[id number]   I saw the patient in the office today for followup after endovascular  repair of his 6.1 cm infrarenal abdominal aortic aneurysm.  This is a  pleasant 77 year old gentleman who had undergone a CT scan as part of  workup for previous prostatectomy and an incidental finding was a 6.1 cm  aneurysm.  Given his age, it was felt that he would do better with an  endovascular repair.  Fortunately, he was a good candidate for this.  He  underwent endovascular repair of his aneurysm using a Gore graft on  03/27/2008.  He did well postoperatively and was discharged on postop  day #2.  He returns for his first outpatient visit.  Overall, he has  been doing quite well and has no specific complaints.   PHYSICAL EXAMINATION:  Blood pressure 126/73, heart rate is 72,  temperature is 97.9.  His groin incisions have healed nicely.  Aneurysm  is not pulsatile.  Both feet are warm and well perfused.   Overall, I am pleased with his progress.  I will have his baseline CT  scan in 3 weeks.  He will then have subsequent CTs at 6 months and 1  year, and then yearly scans thereafter.  I will see him back in 6  months.  He knows to call sooner if he has problems.   Di Kindle. Edilia Bo, M.D.  Electronically Signed   CSD/MEDQ  D:  04/14/2008  T:  04/15/2008  Job:  [id number]   cc:   Dr. Salvatore Decent  Dr. Yetta Flock

## 2010-10-11 NOTE — Op Note (Signed)
[name], Bill [name]               ACCOUNT NO.:  [id number]   MEDICAL RECORD NO.:  [id number]          PATIENT TYPE:  INP   LOCATION:  3308                         FACILITY:  [facility]   PHYSICIAN:  Juleen China IV, MDDATE OF BIRTH:  1930/09/05   DATE OF PROCEDURE:  03/27/2008  DATE OF DISCHARGE:                               OPERATIVE REPORT   PREOPERATIVE DIAGNOSIS:  Abdominal aortic aneurysm.   POSTOPERATIVE DIAGNOSIS:  Abdominal aortic aneurysm.   SURGEON:  1. Durene Cal IV, MD.   PROCEDURE:  Right femoral artery exposure for endovascular aneurysm  repair.   ANESTHESIA:  General.   BLOOD LOSS:  200 mL.   COMPLICATIONS:  None.   FINDINGS:  Please see Dr. Cristal Deer Dickson's operative note for full  details of the procedure.  This is the dictation of the right femoral  artery exposure.   After successful anesthesia was induced, the patient was prepped and  draped in the standard sterile fashion.  A time-out was called and  antibiotics were given.  The right inguinal ligament was identified by  bony landmarks.  An oblique incision was made below the inguinal  ligament anterior to the palpable pulse of the common femoral artery.  Cautery was used to dissect the subcutaneous tissue.  There was a fair  amount of scar tissue from the patient's previous catheterization.  The  femoral artery was dissected free at the level of the inguinal ligament.  It was encircled with a Vessiloop.  Side branches were encircled with  red Vessiloops.  The distal femoral artery was also mobilized and  encircled with a Vessiloop.  At this point in time, the patient was  given systemic heparinization and the artery was accessed with 18-gauge  needle.   Please see Dr. Adele Dan note for details of the aneurysm repair.  Once  the aneurysm repair was completed, sheaths were removed.  Vascular  clamps were used to occlude the artery.  The artery was closed with a  running 6-0 Prolene in  transverse fashion.  Doppler was used to evaluate  a femoral artery signal proximal and distal to the repair, had a good  signal.  Heparin was reversed with Protamine.  Hemostasis was achieved.  The groin was then closed in 3 layers using a 3-0 Vicryl.  The skin was  closed with 4-0 Vicryl.  The patient tolerated the procedure well.          ______________________________  V. Charlena Cross, MD  Electronically Signed    VWB/MEDQ  D:  03/27/2008  T:  03/28/2008  Job:  [id number]

## 2010-10-11 NOTE — Op Note (Signed)
NAMEDASHUN, BORRE               ACCOUNT NO.:  [id number]   MEDICAL RECORD NO.:  [id number]          PATIENT TYPE:  AMB   LOCATION:  ENDO                         [facility]ACILITY[facility]  [facility]   PHYSICIAN:  James L. Malon Kindle., M.D.DATE OF BIRTH:  1931-01-25   DATE OF PROCEDURE:  12/26/2006  DATE OF DISCHARGE:                               OPERATIVE REPORT   PROCEDURE:  Esophagogastroduodenoscopy with Savary esophageal dilatation  under fluoroscopic guidance.   MEDICATIONS:  Cetacaine spray, fentanyl 50 micrograms, Versed 5 mg IV.   INDICATIONS:  Persistent esophageal stricture, requiring dilation.  Has  been dilated to 18 mm in the past.   DESCRIPTION OF OPERATION:  Procedure was explained to the patient and  consent obtained.  The procedure was done, using a portable fluoro unit  in the endoscopy suite.  The scope was inserted and advanced.  There was  a minimal stricture, seen in the distal esophagus, and a hiatal hernia,  as before.  This was easily passed with the scope.  A complete endoscopy  was performed.  The duodenum, including the bulb and second portion, was  normal, as was the pyloric channel, antrum, fundus and cardia.   Using fluoroscopic guidance, a Savary guide wire was placed through the  scope and the scope withdrawn over the guide wire.  Fluoro was used to  confirm location.  I then passed 14, 15 and 16 mm dilators.  After  passage of the 16 mm dilator and we began to pass a 17 mm dilator and  just before passage of the dilator, it was noted the staff had  inadvertently pulled back the guide wire into the proximal esophagus.  The dilator was never passed.  We replaced the endoscope and under  fluoroscopic guidance, replaced the guide wire down to the antrum of the  stomach.  Then, using fluoroscopic guidance, passed the 17 and 18 mm  dilators with a small amount of heme with the 18 mm dilator.  At this  point, the dilator and guide wire were withdrawn as a unit.  There  were  no immediate complications.   ASSESSMENT:  Esophageal stricture, dilated to 18 mm.   PLAN:  Routine post-dilation orders.  Clear liquids for four hours, soft  foods, and we will repeat on a p.r.n. basis.           ______________________________  Llana Aliment Malon Kindle., M.D.     Waldron Session  D:  12/26/2006  T:  12/26/2006  Job:  [id number]

## 2010-10-11 NOTE — Assessment & Plan Note (Signed)
OFFICE VISIT   JASIYAH, PAULDING  DOB:  1931/01/27                                       11/11/2009  JXBJY#:[id number]   I saw the patient in the office today for continued followup of his  aneurysm.  This is a pleasant 77 year old gentleman who underwent  endovascular repair of a 6.1-cm infrarenal abdominal aortic aneurysm in  October 2009.  He returns for a yearly followup visit.  Since I saw him  last, he has had no significant abdominal or back pain.   PAST MEDICAL HISTORY:  Significant for hypertension, which is stable on  his current medications.  In addition, he has a history of COPD which  has also been stable.   SOCIAL HISTORY:  He is widowed.  He quit tobacco in 1992.   REVIEW OF SYSTEMS:  CARDIOVASCULAR:  He had no chest pain, chest  pressure, palpitations or arrhythmias.  He has had no history of stroke,  TIAs or amaurosis fugax.  He has had no history of DVT or phlebitis.  PULMONARY:  He had no recent productive cough, bronchitis, asthma or  wheezing.  NEUROLOGIC:  He has had no dizziness, blackouts, headaches or seizures.   PHYSICAL EXAMINATION:  This is a pleasant 77 year old gentleman who  appears his stated age.  Saturation 98%.  Blood pressure is 159/74,  heart rate 56.  HEENT:  Unremarkable.  Lungs are clear bilaterally to  auscultation without rales, rhonchi or wheezing.  On cardiovascular exam  I do not detect any carotid bruits.  He has a regular rate and rhythm.  Has palpable femoral pulses and warm, well-perfused feet.  Abdomen is  soft and nontender.  His aneurysm is nonpulsatile.  Neurologic exam:  He  has no focal weakness or paresthesias.   I did independently interpret his followup CT scan today, which shows  that the aneurysm has decreased slightly in size.  On my measurement the  maximum diameter is 5.4 cm, which is decreased from 5.7 cm in June 2010.  The stent graft appears to be in good location with no obvious  evidence  of an endoleak.   Overall, I am pleased with his progress.  I think he can continue his  follow-up at 1 year when we see him back in a year, we will do a duplex  scan as opposed a CT scan.  Overall, he has done well.     Di Kindle. Edilia Bo, M.D.  Electronically Signed   CSD/MEDQ  D:  11/11/2009  T:  11/12/2009  Job:  [id number]   cc:   Dr. Yetta Flock  Dr. Reita Cliche

## 2010-10-14 NOTE — Discharge Summary (Signed)
. [facility]  Patient:    Bill [name], Bill [name] Visit Number: [id number] MRN: [id number]          Service Type: MED Location: [phone] Attending Physician:  Alfonso Ramus Dictated by:   Ellwood Handler, M.D. Admit Date:  09/01/2001 Disc. Date: 09/02/01   CC:         Dr. Elenora Fender   Discharge Summary  DATE OF BIRTH:  [birth year]  DISCHARGE DIAGNOSIS:  Atypical chest pain ruled out for myocardial infarction.  DISCHARGE DIAGNOSES BY CHRONIC MEDICAL PROBLEMS: 1. Chronic obstructive pulmonary disease. 2. Hypertension. 3. Gastroesophageal reflux disease/peptic ulcer disease. 4. Hypercholesterolemia.  DISCHARGE MEDICATIONS: 1. Aspirin 325 mg p.o. q.d. 2. Inderal 80 mg p.o. q.d. 3. Tri-Chlor 160 mg p.o. q.d. 4. Prilosec 20 mg p.o. q.d. 5. Amitriptyline 25 mg p.o. q.h.s. 6. Lorazepam 0.5 mg p.o. q.d.  HISTORY OF PRESENT ILLNESS:  Please see full H&P for details.  In brief, Bill [name] is a 77 year old white male who presented to [facility] with onset of vertigo on the day of admission.  In the past, the patient was told that vertigo was secondary to "mini-strokes."  His wife called EMS to check his blood pressure.  The patient, subsequently, came to the emergency room for evaluation and on route to the emergency room he reported approximately 15 minutes of nonradiating substernal chest tightness.  The patient denied any associated shortness of breath, nausea, vomiting, or diaphoresis.  After identifying several risk factors including a history of tobacco abuse, hypercholesterolemia, and hypertension, the patient was admitted to rule out myocardial infarction.  LABORATORY ON ADMISSION: 1. WBC 6.2, hemoglobin 15, hematocrit 45.6, platelets 276, sodium 138,    potassium 4.1, chloride 103, bicarbonate 27, BUN 18, creatinine 1.4,    glucose 94. 2. AST 24, ALT 24, alkaline phosphatase 63, total bilirubin 0.6. 3. His first set of cardiac  enzymes included a CK of 55, CK-MB 2.3, and    troponin-I 0.01.  OTHER STUDIES ON ADMISSION: 1. Chest x-ray which was within normal limits. 2. EKG which showed normal sinus rhythm, no ST changes or T-wave    abnormalities.  HOSPITAL COURSE:  The patient was monitored overnight on telemetry with no events reported.  His EKG on the day of discharged revealed normal sinus rhythm with occasional PVCs.  He was ruled out for MI by cardiac enzymes.  His second set of cardiac enzymes revealed a CK of 53, CK-MB of 1.4, and a troponin-I of 0.01.  His third set of cardiac enzymes revealed a CK of 53, a CK-MB of 1.5, and a troponin-I of less than 0.01.  The patient denied any further episodes of chest discomfort.  DISCHARGE MEDICATIONS:  He was continued on his home medications during the hospitalization including aspirin 325 mg q.d., Inderal 80 mg q.d., Tri-Chlor 160 mg q.d., Prilosec 20 mg q.d., amitriptyline 25 mg q.h.s., and lorazepam 0.5 mg q.d.  DISCHARGE INSTRUCTIONS: 1. The patient was discharged to home in stable condition. 2. He will follow up with Dr. Elenora Fender for further workup as needed as an    outpatient. Dictated by:   Ellwood Handler, M.D. Attending Physician:  Alfonso Ramus DD:  09/02/01 TD:  09/03/01 Job: [id number]

## 2010-10-14 NOTE — Op Note (Signed)
Bill [name], Bill [name]               ACCOUNT NO.:  [id number]   MEDICAL RECORD NO.:  [id number]          PATIENT TYPE:  AMB   LOCATION:  ENDO                         FACILITY:  [facility]   PHYSICIAN:  James L. Malon Kindle., M.D.DATE OF BIRTH:  02-26-1931   DATE OF PROCEDURE:  07/10/2005  DATE OF DISCHARGE:                                 OPERATIVE REPORT   PROCEDURE:  Esophagogastroduodenoscopy with esophageal dilatation.   MEDICATIONS:  Fentanyl 50 mcg, Versed 5 mg IV.   INDICATIONS:  The patient has had dysphagia.  Endoscopy with dilatation done  in September gave some relief.  He was dilated at that time to a 3 [place].  He got some relief for a while, but then his symptoms returned.  For that  reason, we are dilating him again under fluoro.  Try to go to a larger size.   DESCRIPTION OF PROCEDURE:  The procedure had been explained to the patient,  including the risks and benefits, and consent obtained.  Left lateral  decubitus position in the fluoro unit, the Olympus scope was inserted and  advanced.  The stomach was entered.  He had a somewhat tortuous stomach.  We  were able to advance on.  The duodenum was normal.  The scope was withdrawn  back into the stomach.  Using fluoroscopic guidance, a Savary guidewire was  placed over the scope.  With the patient's neck extended, the scope was  withdrawn, and we passed sequentially a 42, 45, 48, 51, and 54 Savary  dilators under fluoroscopic guidance.  There was no heme with removal of any  of the dilators.  The scope was withdrawn.  The patient tolerated the  procedure well.  There were no immediate problems.   ASSESSMENT:  Esophageal stricture, dilated to 29 [place].   PLAN:  Routine post dilation orders.  Clear liquids for four hours.  Call  for shortness of breath, chest pain, etc.  We will see him back in the  office in two months.           ______________________________  Bill Aliment. Malon Kindle., M.D.     Waldron Session  D:  07/10/2005   T:  07/10/2005  Job:  [id number]   cc:   Georgianne Fick, M.D.  Fax: [phone]

## 2010-10-14 NOTE — Op Note (Signed)
NAMEERMIN, PARISIEN                           ACCOUNT NO.:  [id number]   MEDICAL RECORD NO.:  [id number]                   PATIENT TYPE:  AMB   LOCATION:  [facility]                                 FACILITY:  [facility]   PHYSICIAN:  Mark C. Vernie Ammons, M.D.               DATE OF BIRTH:  December 17, 1930   DATE OF PROCEDURE:  01/11/2004  DATE OF DISCHARGE:                                 OPERATIVE REPORT   PREOPERATIVE DIAGNOSIS:  Gross hematuria.   POSTOPERATIVE DIAGNOSES:  1. Gross hematuria.  2. Benign prostatic hypertrophy with bladder outlet obstruction.   SURGEON:  Mark C. Vernie Ammons, M.D.   ANESTHESIA:  General.   DRAINS:  None.   SPECIMENS:  Barbotage urine from bladder, cold cup biopsies of the right  wall, left wall, trigone, and bladder neck regions.   COMPLICATIONS:  None.   INDICATIONS:  The patient is a 77 year old white male, who has known outlet  obstructive symptoms and known left renal calculus.  He was seen for gross  hematuria and was found cystoscopically to have high-grade trabeculation in  the bladder with diffuse oozing and inflammation, but no blood was seen  coming the ureteral orifices.  It was felt to possibly be due to an  infection; however, his culture was negative.  Repeat urinalysis after  completing initial empiric course of antibiotics continued to show red blood  cells.  His urine was tested with NMP-22 and was found to be negative.  He  is brought to the OR today for anesthetic, cysto, bladder biopsies, and  retrograde pyelograms.  The risks, complications, and alternatives were  discussed with the patient.  He understood and wished to proceed.   DESCRIPTION OF PROCEDURE:  After informed consent, the patient brought to  the major OR, placed on the table, administered general anesthesia, then  moved to the dorsal lithotomy position.  His genitalia was sterilely prepped  and draped, and the 21 French cystoscope sheath with obturator were  introduced in the  bladder.  The obturator was removed, and small amount of  sterile saline was instilled into the bladder, and then this was barbotaged.  This specimen was then sent for cytology.   The 12 and 70-degree lenses were then used to inspect the bladder.  It was  noted to be lined with normal-appearing, pink mucosa with no evidence of  inflammatory lesions at this time.  No papillary lesions were seen.  There  was 3-4+ trabeculation.  A few small punctate bladder stones were noted.  The ureteral orifices were normal in configuration and position.  The  prostatic urethra revealed some lateral lobe hypertrophy but did not appear  particularly obstructing.  The bladder neck appeared somewhat erythematous,  but the scope had just recently been in this position for the barbotage.   Right and left retrograde pyelograms were then performed under direct  fluoroscopic visualization using an 8 French cone-tip catheter.  J-hooking  of both ureters was noted.  No filling defects were noted in either ureter  nor any abnormalities of the collecting system.  No mass effects or filling  defects were identified.  Both sides appeared entirely normal.   The cold cup biopsy forceps were then inserted in the bladder, and biopsies  were obtained from the right wall, left wall, trigonal region, and bladder  neck at the 6 o'clock position.  Each of these sites were then fulgurated to  control any bleeding.  The bladder was then drained, and the patient was  awakened after a B&O suppository was inserted.  He tolerated the procedure  well.  There were no intraoperative complications.   He will be given a prescription for 20 Vicodin ES and 18 Pyridium 200 mg.  He will follow up in my office in 2 weeks, and I will contact him with the  results of his biopsy.                                               Mark C. Vernie Ammons, M.D.    MCO/MEDQ  D:  01/11/2004  T:  01/11/2004  Job:  [id number]

## 2010-10-14 NOTE — Discharge Summary (Signed)
[place]. [facility]  Patient:    Bill [name], Bill [name]                        MRN: [id number] Adm. Date:  [id number] Disc. Date: [id number] Attending:  Lesly Dukes CC:         Maricela Bo, M.D.   Discharge Summary  DATE OF BIRTH:  Jan 21, 2031  HISTORY OF PRESENT ILLNESS:  This is one of several [place]. [facility] admissions for this 77 year old, right-handed, white, married male with a known prior of hypertension, who was admitted from the emergency room on January 09, 2000, after symptoms thought to represent a possible stroke.  The patient was doing well.  On the morning of admission, he got up and was sitting in a chair, reading a newspaper when he had the sudden onset of vertigo and dizziness with nausea and vomiting.  He was seen in Dr. Pearson Forster office and was sent to the hospital for evaluation.  On arrival, Carleene Cooper III, M.D., felt that the patient showed evidence of transient right-sided weakness, which had improved by the time he was seen by C. Lesia Sago, M.D.  When seen by Dr. Ignacia Palma, he had a decreased right hand grip and had difficulty in lifting his right leg off of the bed.  While in the emergency room, a CT scan of the brain was obtained, which showed no definite abnormalities and he was admitted to the hospital for a suspected TIA.  PAST MEDICAL HISTORY:  Significant for new onset vertigo with transient right-sided weakness, a history of hypertension, a history of cluster headaches followed by Clabe Seal. Meryl Crutch, M.D., in the past, a history of peptic ulcer disease, status post surgery with gastroesophageal reflux disease, a history of appendectomy, a history of chronic obstructive pulmonary disease, and a history of tremor.  MEDICATIONS AT THE TIME OF ADMISSION: 1. Toprol XL 50 mg q.d. 2. Prilosec 20 mg q.d. 3. Amitriptyline possibly 50-75 mg (dosage unknown) once per day. 4. Lorazepam 0.5 mg q.d. 5. An  inhaler (type unknown).  He was not on aspirin therapy.  This had been discontinued by Maricela Bo, M.D., prior to admission.  ALLERGIES:  He has no known history of drug allergies.  SOCIAL HISTORY:  He does not smoke cigarettes and does not drink alcohol.  FAMILY HISTORY:  Positive in that his father had a myocardial infarction and pneumonia.  There is a positive family history of Alzheimers disease.  PHYSICAL EXAMINATION:  Examination at the time of admission revealed a blood pressure of 159/92 with a heart rate of 66 and a respiratory rate of 20.  He was afebrile.  GENERAL APPEARANCE:  He was a well-developed white male who was alert and cooperative at the time of the examination.  His general examination revealed no significant abnormalities.  NECK:  No carotid bruits were heard.  CHEST:  Clear to auscultation.  HEART:  Examination revealed no murmurs.  NEUROLOGIC:  The cranial nerves examination revealed evidence of flattening of the right nasolabial fold and right ptosis from an old Bells palsy.  Pinprick was equal.  Speech was well enunciated.  His motor examination revealed good strength in the upper and lower extremities.  There was no focal weakness by the time C. Lesia Sago, M.D., saw him.  LABORATORY DATA:  His laboratory studies showed a glucose of 90, a BUN of 20, a sodium of 141, a potassium of 4.5,  a chloride of 107, a CO2 content of 25, and a creatinine of 1.3.  Hemoglobin 15, hematocrit 44.  A CT scan of the brain was unremarkable.  A chest x-ray showed evidence of bilateral atelectasis.  A 12-lead EKG showed a normal sinus rhythm and septal infarct, age indeterminate.  A 2-D echocardiogram interpreted by Jaclyn Prime. Lucas Mallow, M.D., showed left ventricular size normal, overall left ventricular size normal, aortic valve not well visualized, no evidence of aortic valve stenosis, no significant aortic valvular regurgitation, mitral valve not well visualized,  no evidence for mitral stenosis, no significant mitral valvular regurgitation, left atrium normal, right ventricle normal, tricuspid valve and right atrium normal, and no source of cardiac emboli present.  An MRI of the brain showed no acute abnormality.  The MR angiogram of the neck showed unremarkable MRA of the neck and no significant carotid vertebral stenosis. An MRA study of the brain showed some atherosclerotic involvement of the P3 segment on the left of the posterior cerebral artery and possibly some superior cerebellar artery origin stenosis, but no there were significant abnormalities noted.  A urinalysis was not performed.  Other studies included total protein of 5.8, albumin 3.2, AST 16, ALT 9, ALP 73, and total bilirubin 0.5.  HOSPITAL COURSE:  The patient was admitted without recurrent symptoms and kept on heparin therapy without recurrent symptoms.  It was thought that the patient initially may have had a peripheral vestibulopathy, but in view of Dr. Jorene Guest observations, a question of TIA could not be excluded and he was treated with continued heparin.  He was then taken off heparin and placed on aspirin therapy following the 2-D echocardiogram showing no significant abnormalities.  He neurologic examination was remarkable for an old right Bells palsy and diffuse tremor with left hand and arm greater than right.  DISCHARGE DIAGNOSES: 1. Vertigo.  780.4 2. Transient right hemiparesis.  342.00 3. Possible left brain transient ischemic attack.  435.9 4. Hypertension.  796.2 5. 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  251.0 6. Benign essential tremor.  333.1 7. History of cluster headaches.  346.20 8. History of gastroesophageal reflux disease. 9. Condition on discharge.  496.0  ACTIVITY:  No driving for three weeks.  FOLLOW-UP:  To return to see C. Lesia Sago, M.D., in three weeks.  DISCHARGE MEDICATIONS: 1. Toprol XL 50 mg q.d. 2. Lorazepam 0.5 mg q.d. 3. Amitriptyline  dosage unknown. 4. Prilosec 20 mg q.d. 5. Inhaler, dosage unknown.  6. Aspirin 325 mg one q.d.  CONDITION ON DISCHARGE:  He is discharged improved from his prehospital status.  DIET:  Regular. DD:  01/11/00 TD:  01/12/00 Job: [id number]

## 2010-10-14 NOTE — Consult Note (Signed)
Bill [name], Bill [name]               ACCOUNT NO.:  [id number]   MEDICAL RECORD NO.:  [id number]          PATIENT TYPE:  INP   LOCATION:  3733                         FACILITY:  [facility]   PHYSICIAN:  James L. Malon Kindle., M.D.DATE OF BIRTH:  [birth year]   DATE OF CONSULTATION:  12/21/2005  DATE OF DISCHARGE:                                   CONSULTATION   REASON FOR CONSULTATION:  A 77 year old gentleman who was found at home,  confused, with slurred speech.  He was brought into the emergency room.  CT  scan showed no evidence of acute intracranial hemorrhage.  It was felt that  he was having an ischemic stroke, but he recovered quite a bit in the  emergency room and it appeared that this was a TIA.  Patient has improved  considerably.  He does have COPD, essential tremors, and was having issues  with these.  While hospitalized, he had a small amount of bright red blood  with a bowel movement.  I was asked to see him regarding this.   After seeing the patient for some time, he has had some weight loss.  In  September 2006, underwent a colonoscopy for screening.  In spite of the  routine colonoscopy prep, he had a very dirty colon, no gross lesions were  seen.  He had no gross tumors, diverticular disease, etc.  He has not had  any chronic GI bleeding and done well since then.  He has only had the 1  episode of bleeding with a bowel movement which was hard.   CURRENT MEDICATIONS:  1.  Cardura 4 mg daily.  2.  Verapamil SR 240 daily.  3.  Protonix 40 daily.  4.  Plavix 75 daily.  5.  Aspirin.  6.  Multivitamins.  7.  Prednisone.  8.  Atenolol.  9.  Lisinopril.  10. Combivent.  11. Zocor.  12. Tricor.   ALLERGIES:  No drug allergies.   PAST MEDICAL HISTORY:  He does have a history of COPD and emphysema,  hypertension, hyperlipidemia, history of benign tremors.  He has also had a  history of chronic headaches.  He has had esophageal stricture, he has been  dilated several times.   The last time that I dilated him was in February and  was dilated to a 54-French and has done fairly well as long as he chews his  food and eats soft food since then.   REVIEW OF SYSTEMS:  Remarkable for chronic constipation.  No previous  history of GI bleeding.  He does have the dysphagia, which is doing  reasonably well at this point and time.  He was evaluated by speech  pathology.  He did fine with speech pathology evaluation here after his  stroke.  He has never had ulcer, gallbladder problems, etc.   PHYSICAL EXAMINATION:  VITAL SIGNS:  Patient is currently afebrile.  Vital  signs are normal.  GENERAL:  Pleasant, alert, and oriented white male with bilateral tremors,  sitting in a chair, eating.  EYES:  Clear and nonicteric.  THROAT:  Normal.  NECK:  Supple with no lymphadenopathy.  LUNGS:  Clear.  HEART:  Regular rate and rhythm without murmurs or gallops.  ABDOMEN:  Soft and nontender.   ASSESSMENT:  A single episode of hematochezia, probably hemorrhoids.  This  has occurred in the face of constipation and at this point and time he seems  to be doing well.  I think treatment of his constipation is the appropriate  thing given the fact that he did have a colonoscopy last year and I think it  would be fine to go ahead and send him home.   PLAN:  Will start him on MiraLax daily and will go ahead and discharge him  home on that.  Hopefully, that will take care of his chronic constipation.  We will be happy to see him back if he has any further bleeding.           ______________________________  Llana Aliment. Malon Kindle., M.D.     Waldron Session  D:  12/21/2005  T:  12/22/2005  Job:  [id number]

## 2010-10-14 NOTE — Discharge Summary (Signed)
Bill [name], Bill [name] NO.:  [id number]   MEDICAL RECORD NO.:  [id number]          PATIENT TYPE:  INP   LOCATION:  3733                         FACILITY:  [facility]   PHYSICIAN:  Isidor Holts, M.D.  DATE OF BIRTH:  1930-12-28   DATE OF ADMISSION:  12/17/2005  DATE OF DISCHARGE:                                 DISCHARGE SUMMARY   PMD:  Georgianne Fick, M.D.   DISCHARGE DIAGNOSES:  1.  Transient ischemic attack.  2.  Exacerbation of chronic obstructive pulmonary disease.  3.  Benign essential tremor.  4.  History of cluster headaches.  5.  Chronic renal insufficiency.  6.  Dyslipidemia.  7.  Hypertension.  8.  Prior history of dysphagia, secondary to esophageal stricture status      post EGD/dilatation July 10, 2005.  9.  Thyroid nodules.  10. Hematochezia.   DISCHARGE MEDICATIONS:  1.  Cardura 4 mg p.o. daily.  2.  Verapamil SR 240 mg p.o. daily.  3.  Protonix 40 mg p.o. daily.  4.  Plavix 75 mg p.o. daily.  5.  Aspirin enteric-coated 325 mg p.o. daily.  6.  Multivitamin one p.o. daily.  7.  Prednisone 30 mg p.o. daily for three days, then 20 mg p.o. daily for      three days, then 10 mg p.o. daily for three days to be completed on      December 29, 2005.  8.  Atenolol 25 mg p.o. daily.  9.  Lisinopril 10 mg p.o. daily.  10. Combivent inhaler two puffs q.i.d.  11. Zocor 20 mg p.o. nightly to be started on December 19, 2005.  12. Miralax 17 gm in water, b.i.d.  13. TriCor has been discontinued.   PROCEDURES:  1.  Head CT scan dated December 17, 2005 that showed no acute intracranial      findings.  2.  Brain MRI dated December 18, 2005.  This showed no acute abnormality.  There      is a small chronic infarct in the caudate on the right and minimal      chronic ischemic change in the white matter.  3.  Two-view chest x-ray dated December 18, 2005.  This showed moderately severe      changes of chronic obstructive pulmonary disease, no acute infiltrates.  4.   2-D echocardiogram dated December 18, 2005.  This showed technically limited      study, overall left ventricular systolic function, ejection fraction      50%.  Left ventricular wall thickness was mildly increased.  There was      mild right ventricular hypertrophy.  5.  Carotid/vertebral duplex scan.  This showed no significant right      internal carotid artery stenosis, no significant left internal carotid      artery stenosis.  Vertebral atrophy was antegrade bilaterally.   CONSULTS:  Dr. Randa Evens, gastroenterologist   ADMISSION HISTORY:  As in admission notes of December 17, 2005 dictated by Dr.  Della Goo.  However, in brief, this is a 77 year old male, with known  history of COPD/emphysema, hypertension, dyslipidemia, benign  essential  tremor, history of cluster headaches, dysphagia secondary to peptic  stricture, status post esophageal dilatation June 30, 2005 who was found  at home on day of presentation by his daughter, on his couch, slumped over,  with slurred speech as well as confusion.  Patient was transferred to the  emergency department, by which time his symptoms had started improving,  although he did have some respiratory difficulty associated with chest  tightness.  He was admitted for further evaluation, investigation, and  management.   CLINICAL COURSE:  #1 - TRANSIENT ISCHEMIC ATTACK:  Patient presented with  slurred speech and transient confusion.  Physical examination revealed right-  sided weakness and right facial droop.  Symptoms had improved significantly  by the time of physical evaluation in ED, and by ward round of December 18, 2005  there was no obvious focal neurology.  Head CT scan showed no evidence of  acute infarct, neither did follow-up brain MRI, although a small right  caudate chronic infarct was seen on MRI.  Throughout the course of this  hospital stay, there was no recurrence in symptomatology.  He was placed on  antiplatelet medication with  Plavix and Aspirin.  TIA work-up was done.  Carotid/vertebral duplex scan were negative for significant stenosis.  2-D  echocardiogram was quite unremarkable.  Lipid profile showed the following  findings:  TC 132, TG 59, HDL 22, LDL 98.  Patient was originally on TriCor,  presumably for hypertriglyceridemia, but from the above findings it is clear  that patient's triglyceride levels are now excellent.  However, patient has  somewhat elevated LDL at 98.  Given his documented history of  cerebrovascular disease, based on MRI findings, it is clear that target LDL  should be less than 70.  Patient is therefore being commenced on a statin.  However, it is felt that a one week washout period should pass by following  discontinuation, of TriCor before statin is introduced.  It is therefore  recommended that statin be commenced on December 25, 2005.   #2 - CHRONIC OBSTRUCTIVE PULMONARY DISEASE EXACERBATION:  Patient presents  with COPD exacerbation, without evidence of associated infection.  He was  managed with bronchodilator nebulizers, oxygen supplementation, and a  tapering course of steroid, with satisfactory clinical response.  As of December 21, 2005 patient was no longer symptomatic.  He is expected to complete his  steroid taper on December 29, 2005.   #3 - BENIGN ESSENTIAL TREMOR:  This is pretty significant and is associated  with gait instability.  Patient has been commenced on beta blocker, which we  expect to ameliorate these symptoms.  There may be room for further  titration of beta blocker upwards, as indicated.  We shall, however, defer  this to patient's primary M.D.   #4 - MULTINODULAR GOITER:  Review of neck ultrasound scan dated December 04, 2005  in patient's electronic medical records, showed documentation of multiple  thyroid nodules.  TSH during this hospitalization was normal at 0.908.  We  shall defer follow-up to patient's PMD.  #5 - HYPERTENSION:  This was controlled with a  combination of beta blocker,  verapamil SR, and ACE inhibitor.   #6 - HISTORY OF CHRONIC RENAL INSUFFICIENCY:  Patient does have a background  history of benign prostatic hypertrophy.  He was continued on Cardura,  during his hospital stay, and had no symptoms referable to this.  BUN at  presentation was 30 with creatinine of 1.9.  However, with intravenous  fluid  hydration we are happy to note that as of December 20, 2005 renal indices had  normalized with a BUN of 24 and a creatinine of 1.2.   #7 - HISTORY OF DYSPHAGIA:  Patient has known esophageal stricture and  underwent endoscopic dilatation in July 10, 2005 by Dr. Randa Evens,  gastroenterology.  During the course of this hospitalization, there were no  symptoms referable to this.   #8 - DYSLIPIDEMIA:  For details of lipid profile refer to #1 above.  As  mentioned, patient will benefit from a statin.  We expect this to be  commenced on December 25, 2005.   DISPOSITION:  Patient, as mentioned in detailed clinical course above, has  significant essential tremor and some gait instability in part, secondary to  this.  Besides, he appears to have a certain amount of deconditioning.  He  has been evaluated by PT/OT and it is clear that patient would benefit from  a period of rehabilitation, possibly in the skilled nursing facility  environment.  This has been discussed extensively with patient and his  daughter, and they are agreeable to this plan.  Unfortunately, patient  passed some bright red blood in his stools in a.m. of December 21, 2005.  According to patient, this has never occurred before.  This has necessitated  requesting a gastroenterology consultation by Dr. Randa Evens.  Patient,  however, is hemodynamically stable.  Hemoglobin levels are within normal  limits.  As a matter of fact, on December 19, 2005 hemoglobin was 13.3 with  hematocrit of 39.6.  Further disposition will depend on gastroenterologist's  recommendations, i.e., possible  colonoscopy, whether as inpatient or on an  outpatient basis.  Once these recommendations are obtained and implemented  it is likely that patient will be discharged, provided he remains clinically  stable.   DIET:  Healthy heart diet.   ACTIVITY:  As tolerated.  Otherwise, per PT/OT.   WOUND CARE:  Not applicable.   PAIN MANAGEMENT:  Not applicable.   FOLLOW-UP INSTRUCTIONS:  Patient is to follow up routinely with his PMD, Dr.  Georgianne Fick, per prior scheduled appointment.  He is also to follow  up with Dr. Randa Evens, gastroenterologist, at a time to be determined by Dr.  Randa Evens' office.   Note: Patient was seen by gastroenterologist on 12/21/05. Per GI, patient had  a normal colonoscopy 1 year ago. recommendation at this point, is to place  patient on Miralax. Colonoscopy will only be contemplated, if hematochezia  is recurrent.      Isidor Holts, M.D.  Electronically Signed    CO/MEDQ  D:  12/21/2005  T:  12/21/2005  Job:  [id number]   cc:   Georgianne Fick, M.D.  Fax: [phone]   Llana Aliment. Malon Kindle., M.D.  Fax: [phone]

## 2010-10-14 NOTE — H&P (Signed)
[place]  Patient:    Bill [name], Bill [name] Visit Number: [id number] MRN: [id number]          Service Type: MED Location: [id number] Attending Physician:  Alfonso Ramus Dictated by:   Bradly Bienenstock, M.D. Admit Date:  09/01/2001   CC:         Maricela Bo, M.D., [facility]   History and Physical  CHIEF COMPLAINT:  This is a 77 year old black male with onset of chest pain today at roughly 2 oclock in the afternoon.  The patient states that he describes this as a chest tightness, worse in the center of his chest, substernally.  Denies radiation.  Denies shortness of breath.  Denies nausea. Denies diaphoresis.  Actually, ______  called the ambulance to his home to check his blood pressure because he had become "swimmy-headed" this morning after returning from church, a true vertigo feeling with the room spinning. Wife took his blood pressure and got a blood pressure of 113/100.  She was concerned that her blood pressure cuff was wrong and that it might be somewhat higher so ______  called the EMS to check his blood pressure.  Apparently, the patient thought he had similar symptoms with questionable TIAs two years ago, however, in ambulance coming to the hospital, the patient began experiencing chest tightness as described previously.  Pain lasted roughly 15 minutes and then went away on its own.  He did not receive any sublingual nitroglycerin in the ED.  The patient denies any prior history of pain.  He states he can climb one flight of stairs and gets short of breath but no other pain.  Per the patients daughter, who spoke with me as I left the room, apparently the patients best friend died suddenly in the last week and his daughter was concerned that the patients level of anxiety might be very high right now and she wonders if this might be in some way some sort of grief reaction.  REVIEW OF SYSTEMS:  The patient gets  shortness of breath easily with exertion but no chest pain, no recent change in weight, no orthopnea or paroxysmal nocturnal dyspnea.  He is usually not dizzy, although he has had vertiginous spells in the past.  No recent changes in bowel habits.  ROUTINE HEALTH HISTORY: 1. The patient denies ever having had a colonoscopy. 2. He stated he had a prostate biopsy two weeks ago which was normal; he got    this secondary to some nodules on his prostate exam by his primary care    doctor. 3. The patient states he had an exercise treadmill test several years ago but    no stress testing recently.  PAST MEDICAL HISTORY: 1. COPD.  Quit smoking roughly 10 years ago. 2. PUD/GERD. 3. Hypertension. 4. Hypercholesterolemia. 5. Questionable history of TIAs, however, MRI negative for CVAs in 2001, which    was performed at the same time the patient was having these questionable    symptoms. 6. Essential tremor, the patient states secondary to a medication reaction    several years ago. 7. Cluster headaches.  CURRENT MEDICATIONS: 1. Prilosec 20 mg p.o. q.d. 2. Amitriptyline 25 mg q.h.s. 3. Inderal 80 mg q.d. 4. Lorazepam 0.5 mg p.o. q.d. 5. Tricor 160 mg q.d. 6. Aspirin 81 mg q.d.  SOCIAL HISTORY:  The patient does not smoke and does not drink alcohol.  He lives with his wife.  There are no stairs in the home.  He is still driving. He does not use oxygen at home.  FAMILY HISTORY:  His father with an MI at age 17.  Mother had a CVA at age 59. Three sisters, one passed away with Alzheimers, the others in good health.  PHYSICAL EXAMINATION:  VITAL SIGNS:  Initial temperature 97.0, pulse 82, blood pressure 147/101, respiratory rate 24, pulse oxygen 98% on 2 L.  GENERAL:  The patient is alert, in no acute distress, appropriate in conversation.  HEENT:  Normocephalic, atraumatic.  CARDIOVASCULAR:  Distant heart sounds.  Regular rate and rhythm without audible murmur.  PULMONARY:  No  wheezes.  No crackles.  Diminished air exchange.  ABDOMEN:  Nontender and nondistended.  EXTREMITIES:  No edema.  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  Strength is 5/5 to flexion and extension at biceps in upper extremities bilaterally.  Grip strength is 5/5.  Reflexes 2+ at the biceps.  Lower extremity strength is 5/5.  Hip flexion 5/5 bilaterally, 5/5 dorsi and plantar flexion bilaterally.  LABORATORY AND ACCESSORY DATA:  White count 6.2, hemoglobin 16, platelets 276,000.  CK/CK-MB were 55/2.3, troponin 0.01.  Sodium 138, potassium 4.1, chloride 103, bicarb 27, BUN 18, creatinine 1.4, glucose 94, AST 24, ALT 24, alkaline phosphatase 63, total bilirubin 0.6.  EKG:  The patient is in sinus rhythm with a normal axis.  No acute ST segment elevations or depressions.  No T wave inversions.  Portable chest x-ray within normal limits.  ASSESSMENT AND PLAN:  [age]-year-old white male with story atypical for angina, however, given risk factors including age, hypercholesterolemia and hypertension, we will admit for rule out myocardial infarction.  Likely, the patient will need further risk stratification with stress test; this likely will need to be an adenosine stress test, given his lung disease and deconditioning, however, I doubt that this needs to be done in house.  1. Chest pain:  Rule out myocardial infarction with serial enzymes.    Electrocardiogram in a.m.  Aspirin 325 mg q.d.  Beta blocker.  Continue    aspirin regimen.  Admit to telemetry for 23-hour observation.  Will call    Dr. Purnell Shoemaker in the a.m. and ask if he wants [place] to arrange stress testing or    if this can be accomplished as an outpatient. 2. Vertigo:  The patient has had this in the past.  Workup was documented in    chart and was negative for cerebrovascular accident at that time.  Would    hold off on any acute workup  of this in hospital until the patient is     vehemently worsens.  The patient is currently asymptomatic. 3. Gastroesophageal reflux/peptic ulcer disease:  Continue outpatient regimen,    Prevacid. 4. Hypertension:  Continue outpatient regimen and monitor. 5. Question of anxiety:  Likely, we can either discuss this with the patient    in the hospital or this can be followed up with his primary care physician.    He did not bring this up to [place] himself, this was mentioned to me by his    daughter. 6. Disposition:  The patient likely to discharge tomorrow if cardiac enzymes    are negative. Dictated by:   Bradly Bienenstock, M.D. Attending Physician:  Alfonso Ramus DD:  09/01/01 TD:  09/02/01 Job: [id number]

## 2010-10-14 NOTE — Procedures (Signed)
[facility]  Patient:    Bill [name], Bill [name]                        MRN: [id number] Proc. Date: 12/04/00 Adm. Date:  [id number] Attending:  Orland Mustard CC:         Maricela Bo, M.D.   Procedure Report  PROCEDURE:  Esophagogastroduodenoscopy.  MEDICATIONS:  Hurricaine spray, fentanyl 50 mcg, Versed 5 mg IV.  INDICATIONS:  Dysphagia with barium swallow showing no obstruction to barium tablet.  There were signs of presbyesophagus.  The patient is having problems while swallowing.  There was a slight ring.  DESCRIPTION OF PROCEDURE:  The procedure had been explained to the patient and consent obtained.  With the patient in the left lateral decubitus position, the Olympus video endoscope was inserted blindly and advanced under direct visualization.  The stomach was entered, pylorus identified and passed. Duodenum, including the bulb and second portion, were seen well and were unremarkable.  The antrum and body of the stomach were seen well and were normal.  Fundus and cardia were seen on retroflexed view and were normal. Diaphragm was seen at 42 cm, the Z-line at 37 cm.  There was about a 5 cm hiatal hernia.  There was no stricture.  Possibly a very large ring at the GE junction that was widely patent.  The scope easily passed.  The distal esophagus was unremarkable.  The scope was withdrawn.  The vocal cords were seen well on passing and appeared normal.  The scope was withdrawn.  The patient tolerated the procedure well.  ASSESSMENT:  Dysphagia with hiatal hernia with no gross evidence of stricture. I suspect his problems are primarily due to esophageal dysmotility.  PLAN:  We will continue on current medicines and plan an office visit in 2-3 weeks. DD:  12/04/00 TD:  12/04/00 Job: [id number]

## 2010-10-14 NOTE — H&P (Signed)
[place]. [facility]  Patient:    Bill [name], Bill [name]                        MRN: [id number] Adm. Date:  [id number] Attending:  Lesly Dukes CC:         Maricela Bo, M.D.   History and Physical  HISTORY OF PRESENT ILLNESS:  The patient is a 77 year old white male born 1931/03/29.  He has a history of hypertension.  The patient is followed through Dr. Kathrynn Humble and comes to the emergency room from his office for an evaluation of possible stroke-like event.  The patient was doing well initially when he got up this morning, and was sitting in a chair reading the paper.  The patient had the sudden onset of vertigo, dizziness, nausea and vomiting.  The patient was taken to Dr. Chrys Racer office and then was sent to the hospital for an evaluation.  Upon arrival to the hospital, there was a question of right-sided weakness noted by Dr. Carleene Cooper.  The patient had weakness of grip with the right hand and weakness with the right leg when lifting it up off the bed.  The patient has had some improvement in the strength and dizziness at this point and no longer has any focality of his examination.  CT scan of the brain shows no abnormalities.  The patient is being brought into the hospital with probable TIA event possibly involving the brain stem area.  The patient does complain of a slight headache, but the patient does have very frequent headaches anyway.  PAST MEDICAL HISTORY: 1. History of new onset of vertigo and right hemiparesis. 2. History of hypertension. 3. History of cluster type headaches seen by Dr. Meryl Crutch in the past. 4. History of peptic ulcer disease status post surgery.  The patient also has    a history of gastroesophageal reflux disease. 5. History of appendectomy. 6. History of COPD.  MEDICATIONS:  Toprol XL, unknown dose.  Prilosec 20 mg q.d.  Amitriptyline, unknown dose.  Lorazepam, unknown dose.  The patient takes [facility]  as needed.  ALLERGIES:  No known drug allergies.  HABITS:  Nonsmoker and nondrinker.  SOCIAL HISTORY:  The patient is married and lives in the [place] area.  He has two children who are alive and well.  FAMILY HISTORY:  His mother died with cancer.  His father died with pneumonia and had an MI, as well.  The patient had three sisters.  One died with senile dementia of Alzheimers type.  The other two are living and well.  The patient has no brothers.  REVIEW OF SYSTEMS:  No fevers or chills.  The patient denies any neck pain. He does note some chronic shortness of breath.  He denies chest pain.  He denies any abdominal pain.  He has had some nausea and vomiting today.  The patient has not had any blood in his stools or with the vomit, but there was some question whether the patient had coffee ground emesis today.  The patient has had some pain with bowel movement today.  The patient denies any syncope or confusion.  PHYSICAL EXAMINATION:  VITAL SIGNS:  Blood pressure 159/92, heart rate 66, respiratory rate 20, temperature afebrile.  GENERAL:  The patient is a very well-developed white male who is alert and cooperative at the time of the examination.  HEENT:  Atraumatic.  Pupils equal, round and reactive to light.  Disks sharp bilaterally.  Some end gaze nystagmus is seen bilaterally.   Tongue is midline.  Positive gag reflex is noted.  NECK:  Supple.  No carotid bruits noted.  RESPIRATORY:  Clear to auscultation and percussion.  CARDIOVASCULAR:  Regular rate and rhythm without obvious murmurs or rubs.  EXTREMITIES:  Without significant edema.  ABDOMINAL:  No organomegaly or tenderness.  NEUROLOGIC:  Cranial nerves as above.  Facial symmetry is relatively present. there may be a question of slight flattening of the right nasolabial fold. The patient has good strength of facial muscles and the muscles of head turning and shoulder shrug bilaterally.  Pinprick sensation  of the face is symmetric and normal.  Speech is well enunciated.  Motor testing revealed 5/5 strength in all fours.  Good symmetric motor tone is noted throughout. Sensory testing is intact to pinprick, soft touch and vibratory sensation throughout.  The patient has finger-nose-finger and toe-to-finger bilaterally. The patient was not ambulated.  Deep tendon reflexes are depressed, but symmetric.  Toes are neutral bilaterally.  No pronator drift is seen.  LABORATORY DATA:  Glucose 90, BUN 20, sodium 141, potassium 4.5, chloride 107, CO2 25.  Hemoglobin 15, hematocrit 44.  Creatinine 1.3.  CT scan of the brain is unremarkable.  X-ray showed some bilateral atelectasis.  EKG is pending.  IMPRESSION: 1. History of onset of vertigo with right hemiparesis that has resolved. 2. History of hypertension.  SUMMARY:  This patient may have had a TIA today.  Will admit this patient for further evaluation at this point.  Need to rule out vertebrobasilar insufficiency in this case.  PLAN: 1. Admission to [facility]. 2. IV heparin therapy. 3. MRI scan of the brain. 4. MR angiogram. 5. Two-dimensional echocardiogram. 6. Carotid Doppler studies. 7. Follow clinical course while in house. 8. Physical therapy for ambulation. DD:  01/09/00 TD:  01/09/00 Job: [id number]

## 2010-10-14 NOTE — H&P (Signed)
NAMEMELIK, BLANCETT NO.:  [id number]   MEDICAL RECORD NO.:  [id number]          PATIENT TYPE:  INP   LOCATION:  3733                         FACILITY:  [facility]   PHYSICIAN:  Della Goo, M.D. DATE OF BIRTH:  Jul 10, 1930   DATE OF ADMISSION:  12/17/2005  DATE OF DISCHARGE:                                HISTORY & PHYSICAL   This is a patient of Georgianne Fick, M.D.   CHIEF COMPLAINT:  Slurred speech.   HISTORY OF PRESENT ILLNESS:  A 77 year old male who was found at home by his  daughter who was concerned after not being able to reach patient today. The  patient's daughter found him at home on his couch slumped over with slurred  speech. The patient's daughter also reports that the patient had confusion  and was not making any sense. The patient's daughter found him at 6 p.m. at  his home in [place], [place] and the patient was transferred shortly  to the emergency department for further evaluation and treatment. The  patient began to have improvement in his symptoms in the emergency  department. A CAT scan of the brain was performed, results of which were  negative for an acute intracranial hemorrhage or mass. The patient does  report having difficulty breathing along with chest tightness but denies  having chest pain. He denies having any dizziness or syncope associated with  this. He does report having a headache and reports having a history of  cluster headaches of which he is on verapamil therapy.   REVIEW OF SYSTEMS:  The patient denies having any nausea, vomiting,  diarrhea, hemoptysis, hematochezia, hematemesis or melena. He denies having  bowel or bladder habit changes. He reports having headache, denies having  any arthralgias or myalgias. He also denies having any fevers or chills. The  patient does report having difficulty swallowing.   PAST MEDICAL HISTORY:  COPD/emphysema, hypertension, hyperlipidemia, benign  essential tremors,  history of cluster headache, dysphagia and history of  recent esophageal dilatation.   MEDICATIONS:  1.  Cardura 4 mg 1 p.o. daily.  2.  Tricor 145 mg 1 p.o. daily.  3.  Verapamil 240 mg 1 p.o. daily.   ALLERGIES:  No known drug allergies.   SOCIAL HISTORY:  The patient is retired, no history of alcohol usage, quit  tobacco 15 years ago.   FAMILY HISTORY:  Noncontributory.   REVIEW OF SYSTEMS:  Pertinent as mentioned above.   PHYSICAL EXAMINATION:  GENERAL:  This is a 77 year old male, thin, well-  developed in discomfort but no acute distress.  VITAL SIGNS:  Temperature 97.0, blood pressure 180/88, heart rate 100-106,  respirations 20. O2 saturation 99% on 2 liters nasal canula oxygen.  HEENT:  Normocephalic, atraumatic. No scleral icterus. Pupils equal round  and reactive to light. Extraocular muscles are intact. Funduscopic  examination benign findings. Oropharynx clear, no exudate, dry buccal  mucosa.  NECK:  Supple, full range of motion, no thyromegaly, adenopathy, or jugular  venous distention. No carotid bruits.  CARDIOVASCULAR:  Mild tachycardia, no murmur, gallop or rub detected.  ABDOMEN:  Positive  bowel sounds, soft, nontender, nondistended.  EXTREMITIES:  Without cyanosis, clubbing or edema.  NEUROLOGIC:  Alert and oriented x3. Speech at this time is clear and  appropriate. There are no focal deficits on examination.   LABORATORY DATA:  Laboratory studies reveal a CBC with a white blood cell  count of 7.0, hemoglobin 14.7, hematocrit 44.2. MCV 94.3, platelets 197,  neutrophil count 82% and lymphocytes 12%. Myoglobin 77.6. CK-MB less than  1.0. Troponin less than 0.05. Chemistry reveals a sodium of 141, potassium  3.8, chloride 114, bicarb 16.7. BUN 30, creatinine 1.9 and glucose 82.   EKG findings, normal sinus rhythm, no acute ST segment changes. CT of the  head reveals no acute intracranial hemorrhage.   ASSESSMENT:  A 77 year old male with acute onset of  slurred speech along  with weakness in the right side and facial drooping with improvement of  symptoms in the emergency department.   1.  Transient ischemic attack.  2.  Chronic obstructive pulmonary disease exacerbation.  3.  Dysphagia.  4.  Hypertension.  5.  Hyperlipidemia.  6.  Benign essential tremors.  7.  Cluster headaches.  8.  Renal insufficiency.   PLAN:  The patient has been admitted to telemetry area for cardiac  monitoring. Cardiac enzymes will be ordered q.8h. x3. The patient will be  placed on serial neurologic checks. An MRI of the brain and a carotid  ultrasound study has been ordered. The patient will be placed on Plavix and  aspirin therapy. GI and DVT prophylaxis have also been ordered. The patient  will continue on his regular medications and prn clonidine has been ordered  for elevated blood pressures. His increased blood pressure at this time may  be secondary to the patient's cluster headaches or maybe reactive due to  pain and discomfort from the patient's shortness of breath. The patient has  also been placed on a steroid taper and nebulizer treatments for his chronic  obstructive pulmonary disease exacerbation.      Della Goo, M.D.  Electronically Signed     HJ/MEDQ  D:  12/18/2005  T:  12/18/2005  Job:  [id number]   cc:   Georgianne Fick, M.D.  Fax: [phone]

## 2010-12-01 ENCOUNTER — Encounter: Payer: Self-pay | Admitting: Vascular Surgery

## 2010-12-21 ENCOUNTER — Emergency Department (HOSPITAL_COMMUNITY)
Admission: EM | Admit: 2010-12-21 | Discharge: 2010-12-21 | Disposition: A | Discharge status: Home / Self Care | Payer: Medicare Other | Attending: Emergency Medicine | Admitting: Emergency Medicine

## 2010-12-21 ENCOUNTER — Ambulatory Visit: Payer: Self-pay | Admitting: Vascular Surgery

## 2010-12-21 DIAGNOSIS — N390 Urinary tract infection, site not specified: Secondary | ICD-10-CM | POA: Insufficient documentation

## 2010-12-21 DIAGNOSIS — I10 Essential (primary) hypertension: Secondary | ICD-10-CM | POA: Insufficient documentation

## 2010-12-21 DIAGNOSIS — Z8546 Personal history of malignant neoplasm of prostate: Secondary | ICD-10-CM | POA: Insufficient documentation

## 2010-12-21 DIAGNOSIS — R339 Retention of urine, unspecified: Secondary | ICD-10-CM | POA: Insufficient documentation

## 2010-12-21 LAB — URINALYSIS, ROUTINE W REFLEX MICROSCOPIC
Glucose, UA: NEGATIVE mg/dL
Protein, ur: NEGATIVE mg/dL
Specific Gravity, Urine: 1.018 (ref 1.005–1.030)
Urobilinogen, UA: 0.2 mg/dL (ref 0.0–1.0)

## 2010-12-21 LAB — URINE MICROSCOPIC-ADD ON

## 2010-12-22 LAB — URINE CULTURE
Colony Count: NO GROWTH
Culture  Setup Time: 201207260133
Culture: NO GROWTH

## 2011-01-10 ENCOUNTER — Ambulatory Visit (HOSPITAL_COMMUNITY)
Admission: RE | Admit: 2011-01-10 | Discharge: 2011-01-10 | Disposition: A | Discharge status: Home / Self Care | Payer: Medicare Other | Source: Ambulatory Visit | Attending: Urology | Admitting: Urology

## 2011-01-10 ENCOUNTER — Ambulatory Visit (HOSPITAL_BASED_OUTPATIENT_CLINIC_OR_DEPARTMENT_OTHER)
Admission: RE | Admit: 2011-01-10 | Discharge: 2011-01-10 | Disposition: A | Discharge status: Home / Self Care | Payer: Medicare Other | Source: Ambulatory Visit | Attending: Urology | Admitting: Urology

## 2011-01-10 DIAGNOSIS — Z9079 Acquired absence of other genital organ(s): Secondary | ICD-10-CM | POA: Insufficient documentation

## 2011-01-10 DIAGNOSIS — N320 Bladder-neck obstruction: Secondary | ICD-10-CM | POA: Insufficient documentation

## 2011-01-10 DIAGNOSIS — Z01818 Encounter for other preprocedural examination: Secondary | ICD-10-CM | POA: Insufficient documentation

## 2011-01-10 DIAGNOSIS — C61 Malignant neoplasm of prostate: Secondary | ICD-10-CM | POA: Insufficient documentation

## 2011-01-10 DIAGNOSIS — N312 Flaccid neuropathic bladder, not elsewhere classified: Secondary | ICD-10-CM | POA: Insufficient documentation

## 2011-01-10 DIAGNOSIS — Z01812 Encounter for preprocedural laboratory examination: Secondary | ICD-10-CM | POA: Insufficient documentation

## 2011-01-10 DIAGNOSIS — J438 Other emphysema: Secondary | ICD-10-CM | POA: Insufficient documentation

## 2011-01-10 DIAGNOSIS — R059 Cough, unspecified: Secondary | ICD-10-CM | POA: Insufficient documentation

## 2011-01-10 DIAGNOSIS — R05 Cough: Secondary | ICD-10-CM | POA: Insufficient documentation

## 2011-01-10 DIAGNOSIS — Z79899 Other long term (current) drug therapy: Secondary | ICD-10-CM | POA: Insufficient documentation

## 2011-01-10 DIAGNOSIS — R339 Retention of urine, unspecified: Secondary | ICD-10-CM | POA: Insufficient documentation

## 2011-01-10 DIAGNOSIS — I10 Essential (primary) hypertension: Secondary | ICD-10-CM | POA: Insufficient documentation

## 2011-01-10 LAB — POCT I-STAT 4, (NA,K, GLUC, HGB,HCT)
Glucose, Bld: 75 mg/dL (ref 70–99)
Potassium: 4.3 mEq/L (ref 3.5–5.1)
Sodium: 139 mEq/L (ref 135–145)

## 2011-01-13 NOTE — Op Note (Signed)
NAMEBALEN, WOOLUM NO.:  [id number]  MEDICAL RECORD NO.:  [id number]  LOCATION:  XRAY                         FACILITY:  [facility]  PHYSICIAN:  Excell Seltzer. Annabell Howells, M.D.    DATE OF BIRTH:  22-Mar-1931  DATE OF PROCEDURE:  01/10/2011 DATE OF DISCHARGE:  01/10/2011                              OPERATIVE REPORT   PROCEDURE:  Cystoscopy with fulguration and suprapubic cystostomy.  PREOPERATIVE DIAGNOSIS:  Chronic retention.  POSTOPERATIVE DIAGNOSIS:  Chronic retention.  SURGEON:  Dr. Excell Seltzer. Brentlee Delage  ANESTHESIA:  General.  DRAIN:  22-French Foley suprapubic tube.  BLOOD LOSS:  Minimal.  COMPLICATIONS:  None.  INDICATIONS:  Mr. Holcomb is a 77 year old white male with a history of prostate cancer with prior prostatectomy and recurrent bladder neck contracture.  He has had persistent difficulty with urination despite management of his bladder neck contracture and it is felt that he has markedly hypotonic bladder.  After review of his options including chronic Foley catheterization, self-catheterization, and suprapubic cystotomy, suprapubic cystostomy is elected.  FINDINGS AND PROCEDURE:  The patient was taken to operating room, where general anesthetic was induced.  He was placed in lithotomy position. He had been given Cipro and Unasyn, and his Foley catheter was removed. He was placed in Betadine solution,  was draped in usual sterile fashion.  Cystoscopy was performed using a 22-French scope and 12 degree lens.  Examination revealed a normal urethra.  The external sphincter was patulous.  There was a mild bladder neck contracture, but I was able to pass the scope through it.  The bladder was then filled.  Inspection revealed some debris in the bladder from the catheterization, some erythema on the posterior wall from the catheter, and moderate trabeculation.  Once the bladder was filled, the cystoscope was removed and the curved Lowsley retractor was placed  per urethra.  The tip was pressed against the anterior abdominal wall.  The patient was placed in Trendelenburg position and a 15 blade was used to incise down on the tip of the Lowsley, which was easily passed through.  Once outside the abdominal wall, jaws of Lowsley were opened.  A 22-French Foley catheter were grasped in the jaws and brought back into the bladder.  The balloon was filled with 10 cc of sterile fluid.  The catheter was released and the Lowsley was removed.  The catheter was then held on some traction and cystoscopy confirmed good positioning.  The catheter was secured to the skin with a 2-0 nylon suture, was placed to straight drainage.  Repeat cystoscopy was performed.  There was some oozing in the area of the bladder neck contracture.  It was felt to require fulguration. This was done with a Bugbee electrode.  At this point, the drapes were removed.  The patient was taken down from lithotomy position.  His anesthetic was reversed.  He was admitted to recovery room in stable condition.  There were no complications.     Excell Seltzer. Annabell Howells, M.D.     JJW/MEDQ  D:  01/10/2011  T:  01/11/2011  Job:  [id number]  cc:   Dr. Charlott Rakes  Electronically Signed by Bjorn Pippin M.D. on  01/13/2011 01:30:21 PM

## 2011-01-18 ENCOUNTER — Ambulatory Visit: Payer: Self-pay | Admitting: Vascular Surgery

## 2011-02-27 LAB — BLOOD GAS, ARTERIAL
FIO2: 0.21
O2 Saturation: 96.1
pCO2 arterial: 35.1
pO2, Arterial: 81.9

## 2011-02-27 LAB — URINALYSIS, ROUTINE W REFLEX MICROSCOPIC
Bilirubin Urine: NEGATIVE
Glucose, UA: NEGATIVE
Hgb urine dipstick: NEGATIVE
Ketones, ur: NEGATIVE
Nitrite: NEGATIVE
Specific Gravity, Urine: 1.019
pH: 7.5

## 2011-02-27 LAB — CBC
HCT: 39
Hemoglobin: 13.3
MCV: 94
Platelets: 175
Platelets: 227
RBC: 4.89
RDW: 13.8
WBC: 6.1
WBC: 7.3

## 2011-02-27 LAB — POCT I-STAT, CHEM 8
BUN: 21
Calcium, Ion: 1.24
Creatinine, Ser: 1.4
Glucose, Bld: 86
TCO2: 24

## 2011-02-27 LAB — COMPREHENSIVE METABOLIC PANEL
ALT: 10
AST: 16
Albumin: 3.9
Alkaline Phosphatase: 64
CO2: 21
Chloride: 109
Creatinine, Ser: 0.99
GFR calc Af Amer: >60
GFR calc non Af Amer: >60
Potassium: 4.7
Total Bilirubin: 0.8

## 2011-02-27 LAB — APTT: aPTT: 32

## 2011-02-27 LAB — TYPE AND SCREEN: Antibody Screen: NEGATIVE

## 2011-02-27 LAB — BASIC METABOLIC PANEL
Calcium: 9.3
GFR calc non Af Amer: >60
Glucose, Bld: 125 — ABNORMAL HIGH
Potassium: 3.9
Sodium: 133 — ABNORMAL LOW

## 2011-02-27 LAB — ABO/RH: ABO/RH(D): O POS

## 2011-04-26 ENCOUNTER — Ambulatory Visit: Payer: Self-pay | Admitting: Vascular Surgery

## 2011-04-26 ENCOUNTER — Other Ambulatory Visit: Payer: Self-pay

## 2012-04-18 IMAGING — CR DG CHEST 2V
2 series · 2 of 2 positions shown · non-contrast
Comparison: Single view chest 03/28/2008.

CLINICAL DATA: Preop chest x-ray.  Occasional cough.  Chronic
retention

CHEST - 2 VIEW

[w chest pa]
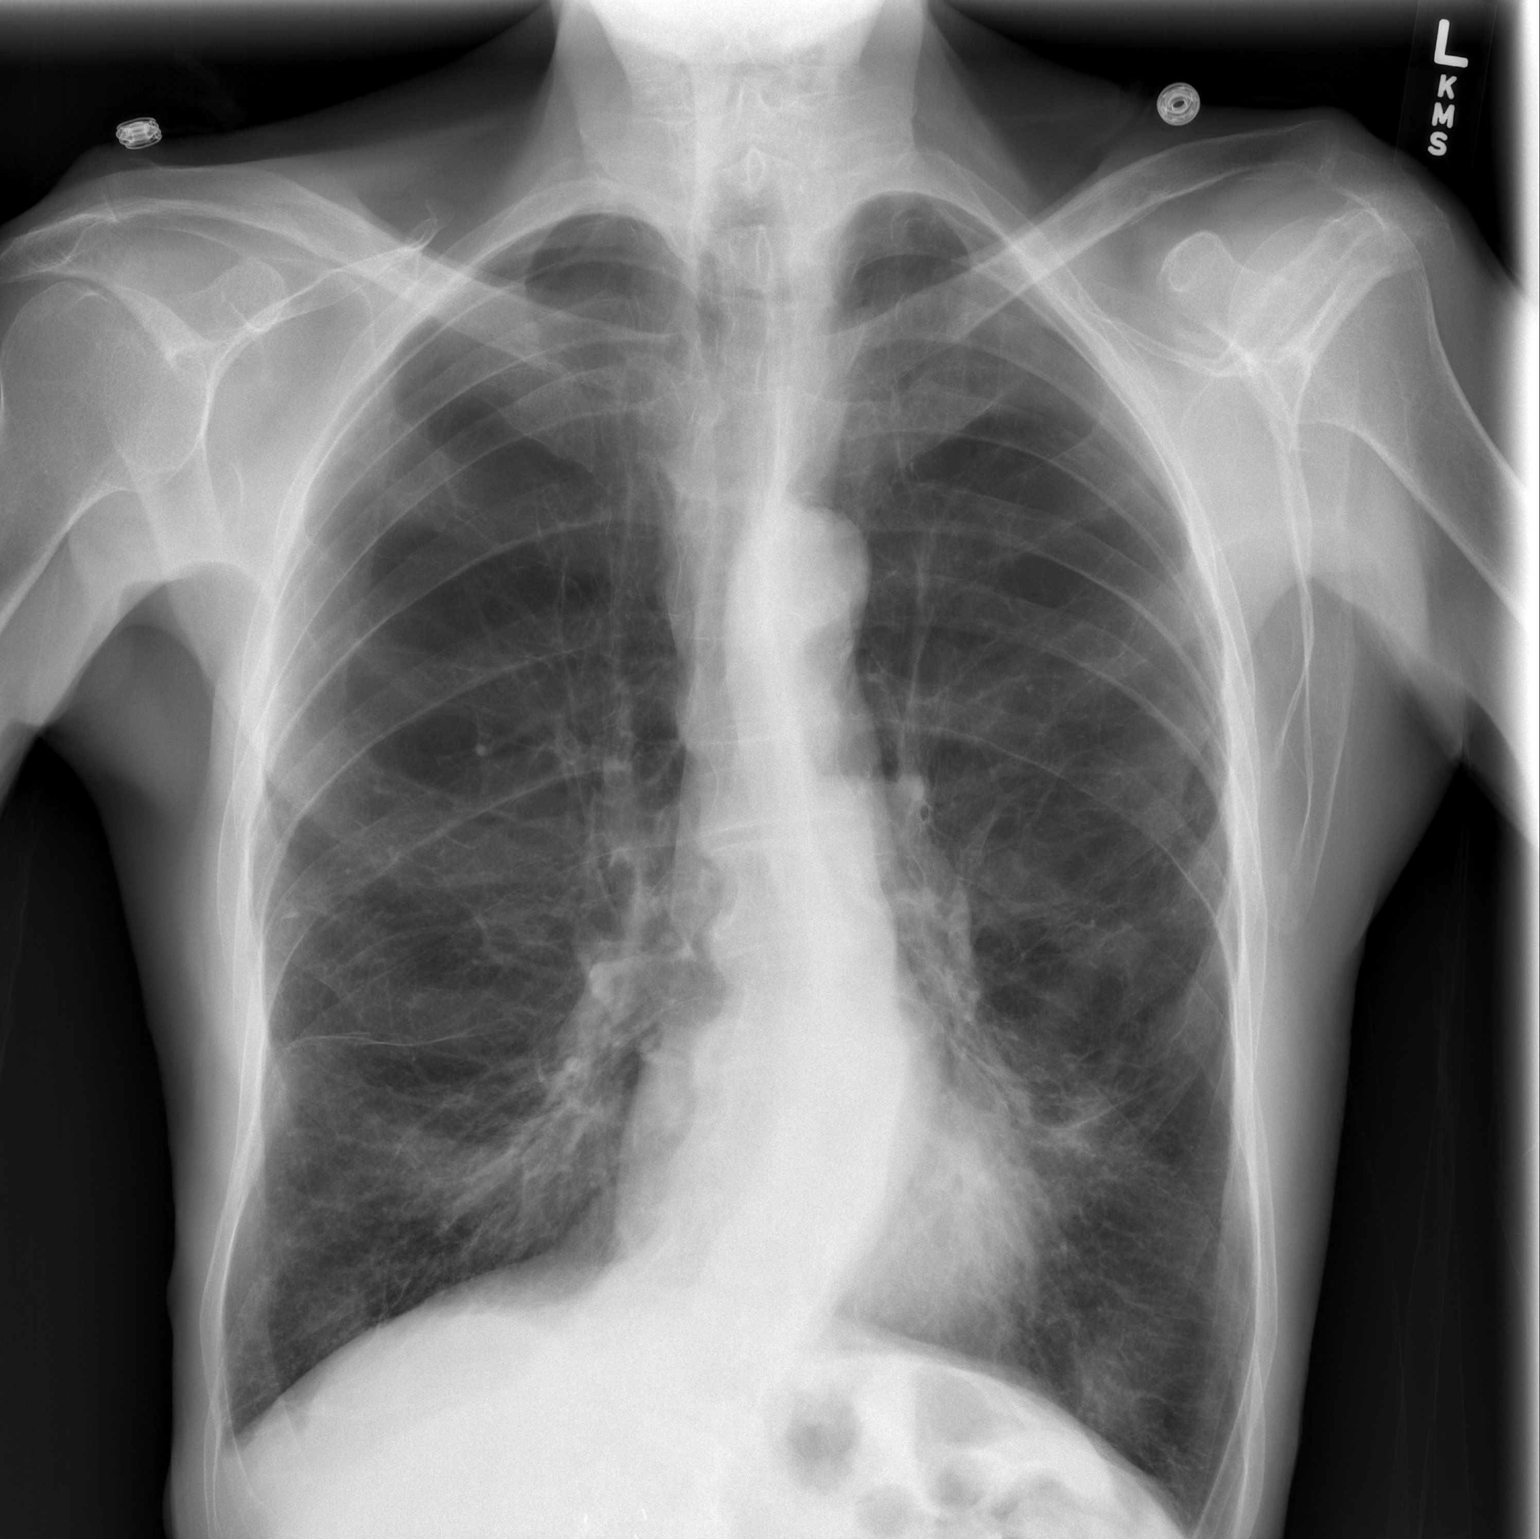

[w chest lat]
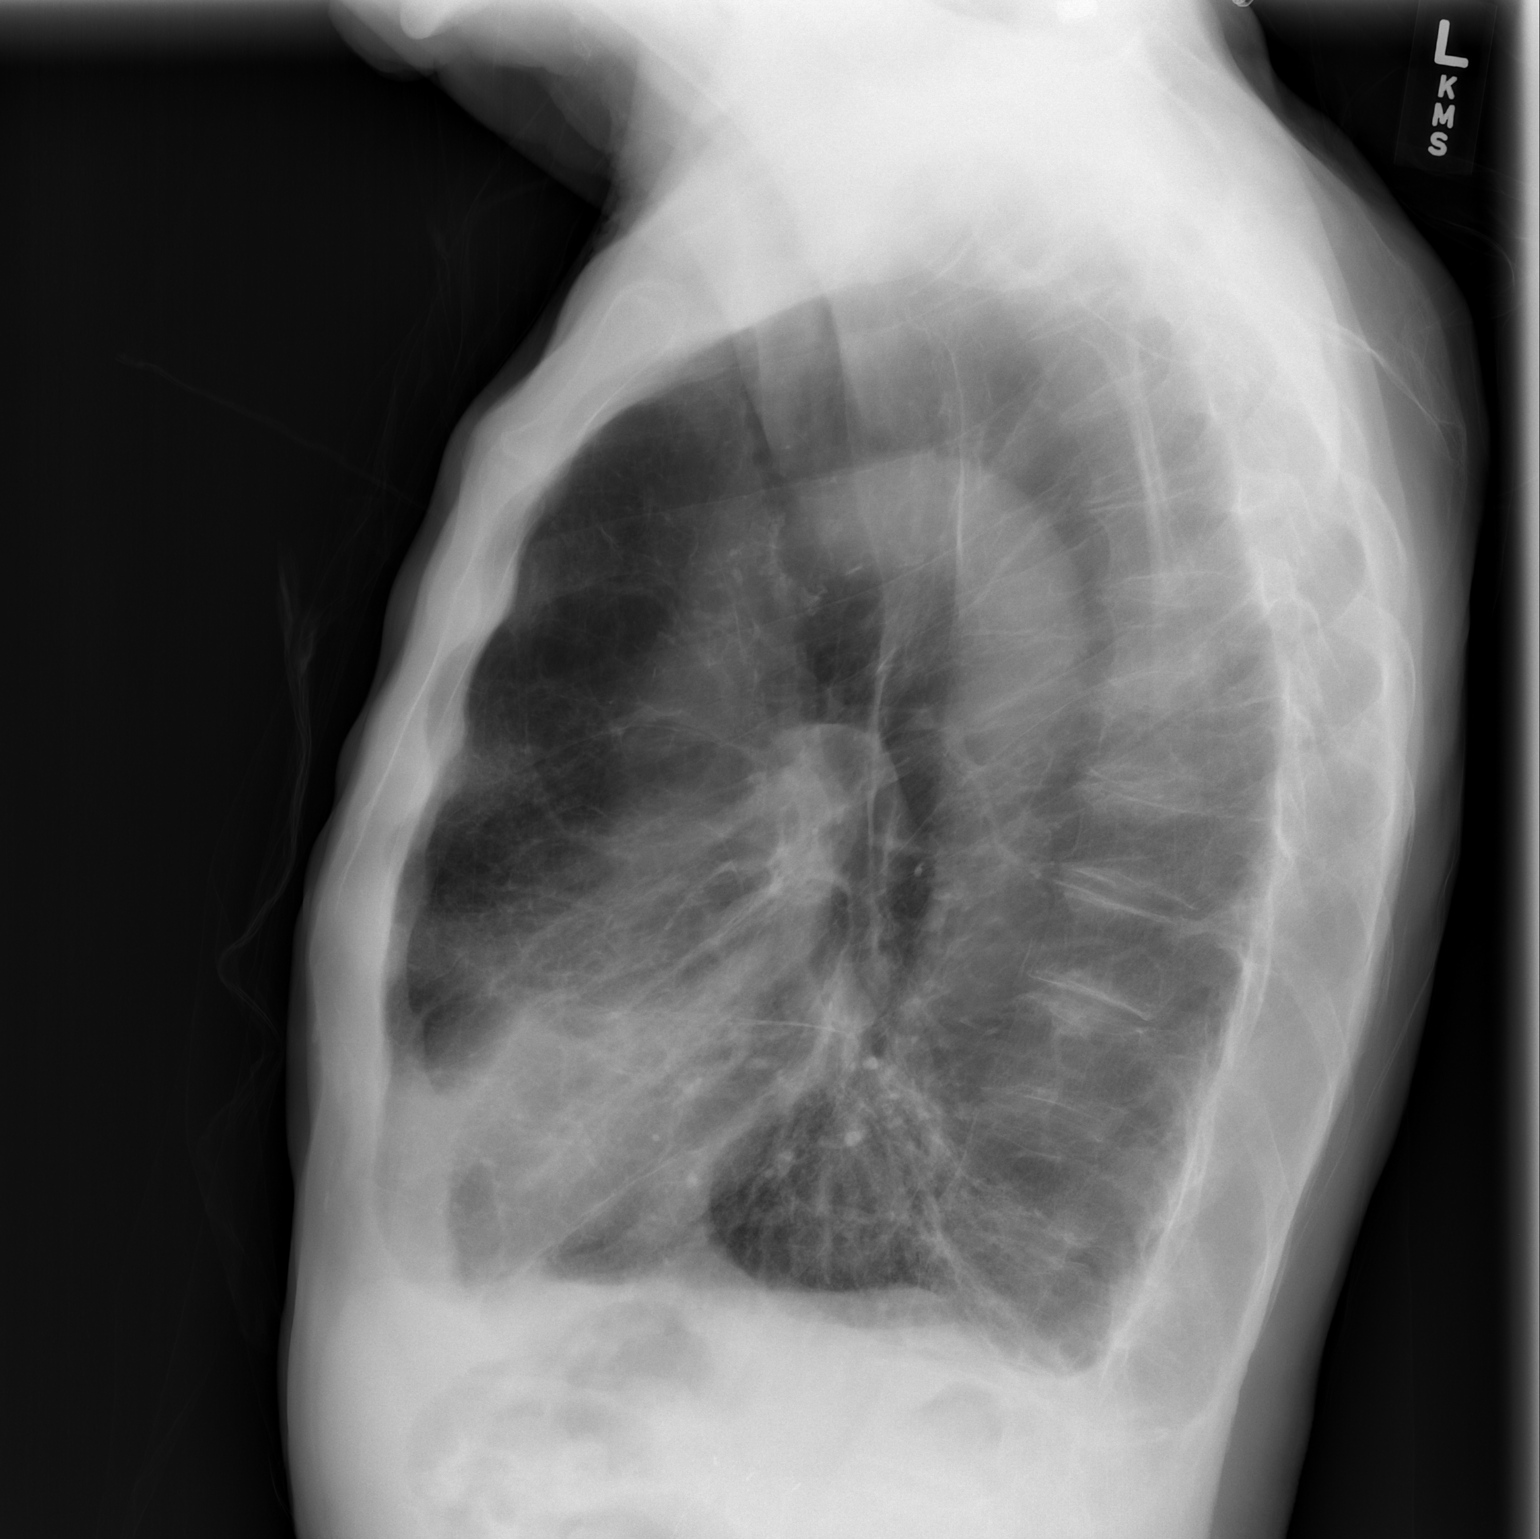

[2 of 2 positions shown; findings below may reference images not displayed]

FINDINGS: The heart size is normal.  Emphysematous changes are
evident.  Infrahilar coarsening and chronic bronchitic change is
noted.  No focal airspace disease is present.  Focal leftward
curvature of the mid thoracic spine is stable.  The visualized soft
tissues and bony thorax are otherwise unremarkable.
IMPRESSION: 1.  Emphysema.
2.  Chronic interstitial coarsening and bronchitic change at the
bases bilaterally.
3.  No acute cardiopulmonary disease.

## 2014-04-07 ENCOUNTER — Encounter (HOSPITAL_COMMUNITY): Payer: Self-pay

## 2014-04-07 ENCOUNTER — Emergency Department (HOSPITAL_COMMUNITY)
Admission: EM | Admit: 2014-04-07 | Discharge: 2014-04-07 | Disposition: A | Discharge status: Home / Self Care | Payer: Medicare Other | Attending: Emergency Medicine | Admitting: Emergency Medicine

## 2014-04-07 ENCOUNTER — Emergency Department (HOSPITAL_COMMUNITY): Payer: Medicare Other

## 2014-04-07 DIAGNOSIS — Z792 Long term (current) use of antibiotics: Secondary | ICD-10-CM | POA: Diagnosis not present

## 2014-04-07 DIAGNOSIS — R0600 Dyspnea, unspecified: Secondary | ICD-10-CM | POA: Diagnosis not present

## 2014-04-07 DIAGNOSIS — B029 Zoster without complications: Secondary | ICD-10-CM | POA: Insufficient documentation

## 2014-04-07 DIAGNOSIS — Z8546 Personal history of malignant neoplasm of prostate: Secondary | ICD-10-CM | POA: Diagnosis not present

## 2014-04-07 DIAGNOSIS — Z8639 Personal history of other endocrine, nutritional and metabolic disease: Secondary | ICD-10-CM | POA: Diagnosis not present

## 2014-04-07 DIAGNOSIS — Z7982 Long term (current) use of aspirin: Secondary | ICD-10-CM | POA: Diagnosis not present

## 2014-04-07 DIAGNOSIS — Z8701 Personal history of pneumonia (recurrent): Secondary | ICD-10-CM | POA: Insufficient documentation

## 2014-04-07 DIAGNOSIS — Z9861 Coronary angioplasty status: Secondary | ICD-10-CM | POA: Insufficient documentation

## 2014-04-07 DIAGNOSIS — Z7951 Long term (current) use of inhaled steroids: Secondary | ICD-10-CM | POA: Insufficient documentation

## 2014-04-07 DIAGNOSIS — Z7952 Long term (current) use of systemic steroids: Secondary | ICD-10-CM | POA: Diagnosis not present

## 2014-04-07 DIAGNOSIS — I10 Essential (primary) hypertension: Secondary | ICD-10-CM | POA: Insufficient documentation

## 2014-04-07 DIAGNOSIS — Z79899 Other long term (current) drug therapy: Secondary | ICD-10-CM | POA: Insufficient documentation

## 2014-04-07 DIAGNOSIS — Z87891 Personal history of nicotine dependence: Secondary | ICD-10-CM | POA: Diagnosis not present

## 2014-04-07 DIAGNOSIS — J449 Chronic obstructive pulmonary disease, unspecified: Secondary | ICD-10-CM | POA: Insufficient documentation

## 2014-04-07 DIAGNOSIS — R0789 Other chest pain: Secondary | ICD-10-CM | POA: Diagnosis not present

## 2014-04-07 HISTORY — DX: Malignant neoplasm of prostate: C61

## 2014-04-07 LAB — CBC WITH DIFFERENTIAL/PLATELET
Basophils Absolute: 0 10*3/uL (ref 0.0–0.1)
Basophils Relative: 0 % (ref 0–1)
Eosinophils Absolute: 0.1 10*3/uL (ref 0.0–0.7)
Eosinophils Relative: 2 % (ref 0–5)
HCT: 34.8 % — ABNORMAL LOW (ref 39.0–52.0)
Hemoglobin: 11.1 g/dL — ABNORMAL LOW (ref 13.0–17.0)
Lymphocytes Relative: 15 % (ref 12–46)
Lymphs Abs: 1.1 10*3/uL (ref 0.7–4.0)
MCH: 26.7 pg (ref 26.0–34.0)
MCHC: 31.9 g/dL (ref 30.0–36.0)
MCV: 83.9 fL (ref 78.0–100.0)
Monocytes Absolute: 0.7 10*3/uL (ref 0.1–1.0)
Monocytes Relative: 9 % (ref 3–12)
Neutro Abs: 5.5 10*3/uL (ref 1.7–7.7)
Neutrophils Relative %: 74 % (ref 43–77)
Platelets: 172 10*3/uL (ref 150–400)
RBC: 4.15 MIL/uL — ABNORMAL LOW (ref 4.22–5.81)
RDW: 17.4 % — ABNORMAL HIGH (ref 11.5–15.5)
WBC: 7.5 10*3/uL (ref 4.0–10.5)

## 2014-04-07 LAB — URINALYSIS, ROUTINE W REFLEX MICROSCOPIC
Glucose, UA: NEGATIVE mg/dL
Hgb urine dipstick: NEGATIVE
Ketones, ur: 15 mg/dL — AB
Nitrite: NEGATIVE
Protein, ur: 30 mg/dL — AB
Specific Gravity, Urine: 1.016 (ref 1.005–1.030)
Urobilinogen, UA: 1 mg/dL (ref 0.0–1.0)
pH: 7 (ref 5.0–8.0)

## 2014-04-07 LAB — BASIC METABOLIC PANEL
Anion gap: 14 (ref 5–15)
BUN: 18 mg/dL (ref 6–23)
CO2: 21 mEq/L (ref 19–32)
Calcium: 9 mg/dL (ref 8.4–10.5)
Chloride: 102 mEq/L (ref 96–112)
Creatinine, Ser: 1.35 mg/dL (ref 0.50–1.35)
GFR calc Af Amer: 55 mL/min — ABNORMAL LOW (ref >90)
GFR calc non Af Amer: 48 mL/min — ABNORMAL LOW (ref >90)
Glucose, Bld: 80 mg/dL (ref 70–99)
Potassium: 4.4 mEq/L (ref 3.7–5.3)
Sodium: 137 mEq/L (ref 137–147)

## 2014-04-07 LAB — URINE MICROSCOPIC-ADD ON

## 2014-04-07 LAB — TROPONIN I: Troponin I: <0.3 ng/mL (ref <0.30)

## 2014-04-07 MED ORDER — SODIUM CHLORIDE 0.9 % IV BOLUS (SEPSIS)
500.0000 mL | Freq: Once | INTRAVENOUS | Status: AC
  Administered 2014-04-07: 500 mL via INTRAVENOUS

## 2014-04-07 MED ORDER — GABAPENTIN 100 MG PO CAPS: 100.0000 mg | ORAL_CAPSULE | Freq: Three times a day (TID) | ORAL | Status: AC

## 2014-04-07 MED ORDER — HYDROCODONE-ACETAMINOPHEN 5-325 MG PO TABS: 1.0000 | ORAL_TABLET | ORAL | Status: DC | PRN

## 2014-04-07 MED ORDER — MORPHINE SULFATE 4 MG/ML IJ SOLN
4.0000 mg | Freq: Once | INTRAMUSCULAR | Status: AC
  Administered 2014-04-07: 4 mg via INTRAVENOUS
  Filled 2014-04-07: qty 1

## 2014-04-07 MED ORDER — ONDANSETRON HCL 4 MG/2ML IJ SOLN
4.0000 mg | Freq: Once | INTRAMUSCULAR | Status: AC
  Administered 2014-04-07: 4 mg via INTRAVENOUS
  Filled 2014-04-07: qty 2

## 2014-04-07 MED ORDER — LORAZEPAM 2 MG/ML IJ SOLN
0.5000 mg | Freq: Once | INTRAMUSCULAR | Status: AC
  Administered 2014-04-07: 0.5 mg via INTRAVENOUS
  Filled 2014-04-07: qty 1

## 2014-04-07 MED ORDER — LORAZEPAM 1 MG PO TABS: 0.5000 mg | ORAL_TABLET | Freq: Two times a day (BID) | ORAL | Status: DC | PRN

## 2014-04-07 NOTE — ED Notes (Signed)
Patient is from home. Patient was diagnosed with shingles 3 weeks ago and has been taking tramadol for pain. Patient last took Tramadol at 0730 this AM. Patient states the pain has not been relieved on the left chest area where the shingles are located. Patient also has a bruise on the right chest area as well. Patient was given NS 120 ml and Fentanyl 100 mcg prior to arrival to the ED per EMS.

## 2014-04-07 NOTE — ED Notes (Signed)
printed cardiac strip to r/o flutter and showed to Dr by charge nurse,,

## 2014-04-07 NOTE — Discharge Instructions (Signed)
Shingles Shingles is caused by the same virus that causes chickenpox. The first feelings may be pain or tingling. A rash will follow in a couple days. The rash may occur on any area of the body. Long-lasting pain is more likely in an elderly person. It can last months to years. There are medicines that can help prevent pain if you start taking them early. HOME CARE   Take cool baths or place cool cloths on the rash as told by your doctor.  Take medicine only as told by your doctor.  Rest as told by your doctor.  Keep your rash clean with mild soap and cool water or as told by your doctor.  Do not scratch your rash. You may use calamine lotion to relieve itchy skin as told by your doctor.  Keep your rash covered with a loose bandage (dressing).  Avoid touching:  Babies.  Pregnant women.  Children with inflamed skin (eczema).  People who have gotten organ transplants.  People with chronic illnesses, such as leukemia or AIDS.  Wear loose-fitting clothing.  If the rash is on the face, you may need to see a specialist. Keep all appointments. Shingles must be kept away from the eyes, if possible.  Keep all follow-up visits as told by your doctor. GET HELP RIGHT AWAY IF:   You have any pain on the face or eye.  You lose feeling on one side of your face.  You have ear pain or ringing in your ear.  You cannot taste as well.  Your medicines do not help the pain.  Your redness or puffiness (swelling) spreads.  You feel like you are getting worse.  You have a fever. MAKE SURE YOU:   Understand these instructions.  Will watch your condition.  Will get help right away if you are not doing well or get worse. Document Released: 11/01/2007 Document Revised: 09/29/2013 Document Reviewed: 11/01/2007 Exit[employer] Patient Information 2015 [employer]. This information is not intended to replace advice given to you by your health care provider. Make sure you discuss any questions  you have with your health care provider.  

## 2014-04-07 NOTE — ED Notes (Signed)
Patient transported to X-ray 

## 2014-04-07 NOTE — ED Notes (Signed)
Bed: WA02 Expected date:  Expected time:  Means of arrival:  Comments: Ems- elderly, healing shingles

## 2014-04-10 NOTE — ED Provider Notes (Signed)
CSN: [id number]     Arrival date & time 04/07/14  1105 History   First MD Initiated Contact with Patient 04/07/14 1147     Chief Complaint  Patient presents with  . Herpes Zoster     (Consider location/radiation/quality/duration/timing/severity/associated sxs/prior Treatment) HPI   80 year old male with left chest pain. He attributes to recently diagnosed shingles. He has been taking tramadol with only mild relief. Requesting additional pain medication. Patient/family report recent admission to outside hospital. He was admitted with pneumonia. Is still taking antibiotics. His breathing has improved. Patient noted to have extensive ecchymosis to right anterior chest wall. This is noted during his hospitalization he says that this is improving. No fevers or chills. No unusual leg pain or swelling.  Past Medical History  Diagnosis Date  . Hypertension   . COPD (chronic obstructive pulmonary disease)   . AAA (abdominal aortic aneurysm)   . Hyperlipidemia   . Prostate cancer    Past Surgical History  Procedure Laterality Date  . Transurethral resection of prostate    . Endovascular stent insertion  03/27/08    Endovascular Repair of AAA w/ Gore Graft  . Stomach surgery  1971    peptic ulcer disease   Family History  Problem Relation Age of Onset  . Aneurysm Mother     intracranial aneurysm   History  Substance Use Topics  . Smoking status: Former Smoker    Quit date: 05/02/1996  . Smokeless tobacco: Never Used  . Alcohol Use: No    Review of Systems  All systems reviewed and negative, other than as noted in HPI.   Allergies  Review of patient's allergies indicates no known allergies.  Home Medications   Prior to Admission medications   Medication Sig Start Date End Date Taking? Authorizing Provider  amoxicillin-clavulanate (AUGMENTIN) 875-125 MG per tablet Take 1 tablet by mouth 2 (two) times daily. For 7 days 03/31/14  Yes Historical Provider, MD  aspirin 81 MG  tablet Take 81 mg by mouth daily as needed for pain.   Yes Historical Provider, MD  doxazosin (CARDURA) 8 MG tablet Take 8 mg by mouth daily.   Yes Historical Provider, MD  FLORA-Q [facility]) CAPS capsule Take 1 capsule by mouth 2 (two) times daily.   Yes Historical Provider, MD  Fluticasone-Salmeterol (ADVAIR) 250-50 MCG/DOSE AEPB Inhale 1 puff into the lungs 2 (two) times daily.   Yes Historical Provider, MD  levofloxacin (LEVAQUIN) 750 MG tablet Take 750 mg by mouth every other day. For 10 days 03/31/14  Yes Historical Provider, MD  mirabegron ER (MYRBETRIQ) 50 MG TB24 tablet Take 50 mg by mouth daily.   Yes Historical Provider, MD  mirtazapine (REMERON) 15 MG tablet Take 15 mg by mouth at bedtime.   Yes Historical Provider, MD  omeprazole (PRILOSEC) 20 MG capsule Take 20 mg by mouth daily.   Yes Historical Provider, MD  predniSONE (DELTASONE) 20 MG tablet Take 40 mg by mouth daily with breakfast.   Yes Historical Provider, MD  traMADol (ULTRAM) 50 MG tablet Take 50 mg by mouth every 6 (six) hours as needed for moderate pain.   Yes Historical Provider, MD  verapamil (COVERA HS) 180 MG (CO) 24 hr tablet Take 180 mg by mouth daily.   Yes Historical Provider, MD  gabapentin (NEURONTIN) 100 MG capsule Take 1 capsule (100 mg total) by mouth 3 (three) times daily. 04/07/14   Virgel Manifold, MD  HYDROcodone-acetaminophen (NORCO/VICODIN) 5-325 MG per tablet Take 1-2 tablets by mouth every  4 (four) hours as needed for moderate pain or severe pain. 04/07/14   Virgel Manifold, MD  lisinopril (PRINIVIL,ZESTRIL) 20 MG tablet Take 20 mg by mouth daily.      Historical Provider, MD  LORazepam (ATIVAN) 1 MG tablet Take 0.5 tablets (0.5 mg total) by mouth 2 (two) times daily as needed for anxiety or sleep. 04/07/14   Virgel Manifold, MD  pantoprazole (PROTONIX) 40 MG tablet Take 40 mg by mouth daily.      Historical Provider, MD  propranolol (INDERAL) 20 MG tablet Take 20 mg by mouth 2 (two) times daily.      Historical  Provider, MD  Tamsulosin HCl (FLOMAX) 0.4 MG CAPS Take 0.4 mg by mouth daily.      Historical Provider, MD  traZODone (DESYREL) 150 MG tablet Take 150 mg by mouth daily.      Historical Provider, MD  verapamil (COVERA HS) 240 MG (CO) 24 hr tablet Take 240 mg by mouth daily.      Historical Provider, MD   BP 148/75 mmHg  Pulse 83  Temp(Src) 97.6 F (36.4 C) (Oral)  Resp 12  SpO2 97% Physical Exam  Constitutional: No distress.  Tired/frail appearing, but not toxic.   HENT:  Head: Normocephalic and atraumatic.  Eyes: Conjunctivae are normal. Right eye exhibits no discharge. Left eye exhibits no discharge.  Neck: Neck supple.  Cardiovascular: Normal rate, regular rhythm and normal heart sounds.  Exam reveals no gallop and no friction rub.   No murmur heard. Pulmonary/Chest: Effort normal and breath sounds normal. No respiratory distress.  Abdominal: Soft. He exhibits no distension. There is no tenderness.  Musculoskeletal: He exhibits no edema or tenderness.  Neurological: He is alert.  Skin: Skin is warm and dry.  Rash consistent with resolving shingles L thoracic region. Extensive ecchymosis to R chest wall. No crepitus. Breath sounds symmetric. No increased WOB.   Psychiatric: He has a normal mood and affect. His behavior is normal. Thought content normal.  Nursing note and vitals reviewed.   ED Course  Procedures (including critical care time) Labs Review Labs Reviewed  BASIC METABOLIC PANEL - Abnormal; Notable for the following:    GFR calc non Af Amer 48 (*)    GFR calc Af Amer 55 (*)    All other components within normal limits  CBC WITH DIFFERENTIAL - Abnormal; Notable for the following:    RBC 4.15 (*)    Hemoglobin 11.1 (*)    HCT 34.8 (*)    RDW 17.4 (*)    All other components within normal limits  URINALYSIS, ROUTINE W REFLEX MICROSCOPIC - Abnormal; Notable for the following:    Bilirubin Urine SMALL (*)    Ketones, ur 15 (*)    Protein, ur 30 (*)     Leukocytes, UA MODERATE (*)    All other components within normal limits  TROPONIN I  URINE MICROSCOPIC-ADD ON    Imaging Review No results found.   EKG Interpretation   Date/Time:  Tuesday April 07 2014 11:12:38 EST Ventricular Rate:  85 PR Interval:  156 QRS Duration: 81 QT Interval:  383 QTC Calculation: 455 R Axis:   76 Text Interpretation:  Sinus rhythm Ventricular premature complex ED  PHYSICIAN INTERPRETATION AVAILABLE IN [facility] Confirmed by TEST,  Record ([id number]) on 04/09/2014 7:42:01 AM      MDM   Final diagnoses:  Dyspnea  Shingles    83yM with shingles and currently poorly controlled symptoms. Additional meds provided. Current tx  for pneumonia. CXR today w/o focal infiltrate. Extensive ecchymosis to R chest, but reports had during recent admission to OSH and improving.     Virgel Manifold, MD 04/16/14 [phone]

## 2014-05-04 ENCOUNTER — Emergency Department (HOSPITAL_COMMUNITY): Payer: Medicare Other

## 2014-05-04 ENCOUNTER — Encounter (HOSPITAL_COMMUNITY): Payer: Self-pay | Admitting: *Deleted

## 2014-05-04 ENCOUNTER — Inpatient Hospital Stay (HOSPITAL_COMMUNITY)
Admission: EM | Admit: 2014-05-04 | Discharge: 2014-05-09 | DRG: 871 | Disposition: A | Discharge status: Hospice | Payer: Medicare Other | Attending: Internal Medicine | Admitting: Internal Medicine

## 2014-05-04 DIAGNOSIS — R0689 Other abnormalities of breathing: Secondary | ICD-10-CM | POA: Diagnosis not present

## 2014-05-04 DIAGNOSIS — J189 Pneumonia, unspecified organism: Secondary | ICD-10-CM | POA: Diagnosis present

## 2014-05-04 DIAGNOSIS — I10 Essential (primary) hypertension: Secondary | ICD-10-CM

## 2014-05-04 DIAGNOSIS — Z7401 Bed confinement status: Secondary | ICD-10-CM

## 2014-05-04 DIAGNOSIS — Y95 Nosocomial condition: Secondary | ICD-10-CM | POA: Diagnosis present

## 2014-05-04 DIAGNOSIS — A419 Sepsis, unspecified organism: Principal | ICD-10-CM | POA: Diagnosis present

## 2014-05-04 DIAGNOSIS — R0600 Dyspnea, unspecified: Secondary | ICD-10-CM | POA: Diagnosis not present

## 2014-05-04 DIAGNOSIS — E785 Hyperlipidemia, unspecified: Secondary | ICD-10-CM | POA: Diagnosis present

## 2014-05-04 DIAGNOSIS — N189 Chronic kidney disease, unspecified: Secondary | ICD-10-CM | POA: Diagnosis present

## 2014-05-04 DIAGNOSIS — Z87891 Personal history of nicotine dependence: Secondary | ICD-10-CM | POA: Diagnosis not present

## 2014-05-04 DIAGNOSIS — K5900 Constipation, unspecified: Secondary | ICD-10-CM | POA: Diagnosis present

## 2014-05-04 DIAGNOSIS — T8351XA Infection and inflammatory reaction due to indwelling urinary catheter, initial encounter: Secondary | ICD-10-CM | POA: Diagnosis present

## 2014-05-04 DIAGNOSIS — R109 Unspecified abdominal pain: Secondary | ICD-10-CM | POA: Diagnosis not present

## 2014-05-04 DIAGNOSIS — N170 Acute kidney failure with tubular necrosis: Secondary | ICD-10-CM | POA: Diagnosis present

## 2014-05-04 DIAGNOSIS — Z8711 Personal history of peptic ulcer disease: Secondary | ICD-10-CM

## 2014-05-04 DIAGNOSIS — J449 Chronic obstructive pulmonary disease, unspecified: Secondary | ICD-10-CM | POA: Diagnosis present

## 2014-05-04 DIAGNOSIS — B3749 Other urogenital candidiasis: Secondary | ICD-10-CM | POA: Diagnosis present

## 2014-05-04 DIAGNOSIS — I129 Hypertensive chronic kidney disease with stage 1 through stage 4 chronic kidney disease, or unspecified chronic kidney disease: Secondary | ICD-10-CM | POA: Diagnosis present

## 2014-05-04 DIAGNOSIS — J690 Pneumonitis due to inhalation of food and vomit: Secondary | ICD-10-CM | POA: Diagnosis present

## 2014-05-04 DIAGNOSIS — B0229 Other postherpetic nervous system involvement: Secondary | ICD-10-CM | POA: Diagnosis present

## 2014-05-04 DIAGNOSIS — Z681 Body mass index (BMI) 19 or less, adult: Secondary | ICD-10-CM

## 2014-05-04 DIAGNOSIS — E43 Unspecified severe protein-calorie malnutrition: Secondary | ICD-10-CM | POA: Diagnosis present

## 2014-05-04 DIAGNOSIS — N390 Urinary tract infection, site not specified: Secondary | ICD-10-CM | POA: Diagnosis present

## 2014-05-04 DIAGNOSIS — Z66 Do not resuscitate: Secondary | ICD-10-CM | POA: Diagnosis present

## 2014-05-04 DIAGNOSIS — K920 Hematemesis: Secondary | ICD-10-CM | POA: Diagnosis present

## 2014-05-04 DIAGNOSIS — R1110 Vomiting, unspecified: Secondary | ICD-10-CM | POA: Diagnosis present

## 2014-05-04 DIAGNOSIS — I714 Abdominal aortic aneurysm, without rupture: Secondary | ICD-10-CM | POA: Diagnosis present

## 2014-05-04 DIAGNOSIS — Z515 Encounter for palliative care: Secondary | ICD-10-CM | POA: Diagnosis not present

## 2014-05-04 DIAGNOSIS — Z936 Other artificial openings of urinary tract status: Secondary | ICD-10-CM

## 2014-05-04 DIAGNOSIS — Z9079 Acquired absence of other genital organ(s): Secondary | ICD-10-CM | POA: Diagnosis present

## 2014-05-04 DIAGNOSIS — R630 Anorexia: Secondary | ICD-10-CM

## 2014-05-04 DIAGNOSIS — F419 Anxiety disorder, unspecified: Secondary | ICD-10-CM | POA: Diagnosis present

## 2014-05-04 DIAGNOSIS — Z8546 Personal history of malignant neoplasm of prostate: Secondary | ICD-10-CM | POA: Diagnosis not present

## 2014-05-04 DIAGNOSIS — R627 Adult failure to thrive: Secondary | ICD-10-CM | POA: Diagnosis present

## 2014-05-04 DIAGNOSIS — R1084 Generalized abdominal pain: Secondary | ICD-10-CM

## 2014-05-04 LAB — COMPREHENSIVE METABOLIC PANEL
ALK PHOS: 82 U/L (ref 39–117)
ALT: 8 U/L (ref 0–53)
AST: 24 U/L (ref 0–37)
Albumin: 3.1 g/dL — ABNORMAL LOW (ref 3.5–5.2)
Anion gap: 17 — ABNORMAL HIGH (ref 5–15)
BILIRUBIN TOTAL: 0.8 mg/dL (ref 0.3–1.2)
BUN: 31 mg/dL — ABNORMAL HIGH (ref 6–23)
CHLORIDE: 102 meq/L (ref 96–112)
CO2: 21 mEq/L (ref 19–32)
Calcium: 9.5 mg/dL (ref 8.4–10.5)
Creatinine, Ser: 1.54 mg/dL — ABNORMAL HIGH (ref 0.50–1.35)
GFR calc Af Amer: 47 mL/min — ABNORMAL LOW (ref >90)
GFR calc non Af Amer: 41 mL/min — ABNORMAL LOW (ref >90)
Glucose, Bld: 134 mg/dL — ABNORMAL HIGH (ref 70–99)
POTASSIUM: 4.7 meq/L (ref 3.7–5.3)
Sodium: 140 mEq/L (ref 137–147)
Total Protein: 6.1 g/dL (ref 6.0–8.3)

## 2014-05-04 LAB — URINE MICROSCOPIC-ADD ON

## 2014-05-04 LAB — I-STAT CHEM 8, ED
BUN: 32 mg/dL — ABNORMAL HIGH (ref 6–23)
Calcium, Ion: 1.18 mmol/L (ref 1.13–1.30)
Chloride: 105 mEq/L (ref 96–112)
Creatinine, Ser: 1.7 mg/dL — ABNORMAL HIGH (ref 0.50–1.35)
Glucose, Bld: 129 mg/dL — ABNORMAL HIGH (ref 70–99)
HCT: 44 % (ref 39.0–52.0)
HEMOGLOBIN: 15 g/dL (ref 13.0–17.0)
Potassium: 4.5 mEq/L (ref 3.7–5.3)
Sodium: 138 mEq/L (ref 137–147)
TCO2: 22 mmol/L (ref 0–100)

## 2014-05-04 LAB — URINALYSIS, ROUTINE W REFLEX MICROSCOPIC
Glucose, UA: NEGATIVE mg/dL
Hgb urine dipstick: NEGATIVE
Ketones, ur: 40 mg/dL — AB
Nitrite: POSITIVE — AB
PROTEIN: 30 mg/dL — AB
Specific Gravity, Urine: 1.029 (ref 1.005–1.030)
Urobilinogen, UA: 4 mg/dL — ABNORMAL HIGH (ref 0.0–1.0)
pH: 5 (ref 5.0–8.0)

## 2014-05-04 LAB — CBC WITH DIFFERENTIAL/PLATELET
BASOS ABS: 0 10*3/uL (ref 0.0–0.1)
Basophils Relative: 0 % (ref 0–1)
EOS PCT: 0 % (ref 0–5)
Eosinophils Absolute: 0 10*3/uL (ref 0.0–0.7)
HEMATOCRIT: 42.3 % (ref 39.0–52.0)
Hemoglobin: 13.5 g/dL (ref 13.0–17.0)
Lymphocytes Relative: 3 % — ABNORMAL LOW (ref 12–46)
Lymphs Abs: 0.4 10*3/uL — ABNORMAL LOW (ref 0.7–4.0)
MCH: 27.2 pg (ref 26.0–34.0)
MCHC: 31.9 g/dL (ref 30.0–36.0)
MCV: 85.3 fL (ref 78.0–100.0)
MONO ABS: 0.9 10*3/uL (ref 0.1–1.0)
Monocytes Relative: 6 % (ref 3–12)
Neutro Abs: 14.1 10*3/uL — ABNORMAL HIGH (ref 1.7–7.7)
Neutrophils Relative %: 91 % — ABNORMAL HIGH (ref 43–77)
Platelets: 276 10*3/uL (ref 150–400)
RBC: 4.96 MIL/uL (ref 4.22–5.81)
RDW: 16.8 % — AB (ref 11.5–15.5)
WBC: 15.4 10*3/uL — AB (ref 4.0–10.5)

## 2014-05-04 LAB — TYPE AND SCREEN
ABO/RH(D): O POS
ANTIBODY SCREEN: NEGATIVE

## 2014-05-04 LAB — PROTIME-INR
INR: 1.06 (ref 0.00–1.49)
Prothrombin Time: 13.9 seconds (ref 11.6–15.2)

## 2014-05-04 LAB — ABO/RH: ABO/RH(D): O POS

## 2014-05-04 LAB — I-STAT CG4 LACTIC ACID, ED: Lactic Acid, Venous: 3.81 mmol/L — ABNORMAL HIGH (ref 0.5–2.2)

## 2014-05-04 LAB — LIPASE, BLOOD: Lipase: 21 U/L (ref 11–59)

## 2014-05-04 MED ORDER — FENTANYL CITRATE 0.05 MG/ML IJ SOLN
50.0000 ug | Freq: Once | INTRAMUSCULAR | Status: AC
  Administered 2014-05-04: 50 ug via INTRAVENOUS
  Filled 2014-05-04: qty 2

## 2014-05-04 MED ORDER — VANCOMYCIN HCL 500 MG IV SOLR
500.0000 mg | INTRAVENOUS | Status: DC
  Administered 2014-05-04 – 2014-05-07 (×4): 500 mg via INTRAVENOUS
  Filled 2014-05-04 (×4): qty 500

## 2014-05-04 MED ORDER — ACETAMINOPHEN 650 MG RE SUPP: 650.0000 mg | Freq: Four times a day (QID) | RECTAL | Status: DC | PRN

## 2014-05-04 MED ORDER — ONDANSETRON HCL 4 MG PO TABS: 4.0000 mg | ORAL_TABLET | Freq: Four times a day (QID) | ORAL | Status: DC | PRN

## 2014-05-04 MED ORDER — GABAPENTIN 100 MG PO CAPS
100.0000 mg | ORAL_CAPSULE | Freq: Three times a day (TID) | ORAL | Status: DC
  Administered 2014-05-04 – 2014-05-09 (×14): 100 mg via ORAL
  Filled 2014-05-04 (×17): qty 1

## 2014-05-04 MED ORDER — SODIUM CHLORIDE 0.9 % IV BOLUS (SEPSIS)
1000.0000 mL | Freq: Once | INTRAVENOUS | Status: AC
  Administered 2014-05-04: 1000 mL via INTRAVENOUS

## 2014-05-04 MED ORDER — MIRABEGRON ER 50 MG PO TB24
50.0000 mg | ORAL_TABLET | Freq: Every day | ORAL | Status: DC
  Administered 2014-05-04 – 2014-05-09 (×6): 50 mg via ORAL
  Filled 2014-05-04 (×6): qty 1

## 2014-05-04 MED ORDER — MORPHINE SULFATE 2 MG/ML IJ SOLN
2.0000 mg | INTRAMUSCULAR | Status: DC | PRN
  Administered 2014-05-04 – 2014-05-07 (×10): 2 mg via INTRAVENOUS
  Filled 2014-05-04 (×11): qty 1

## 2014-05-04 MED ORDER — PIPERACILLIN-TAZOBACTAM 3.375 G IVPB
3.3750 g | Freq: Once | INTRAVENOUS | Status: AC
  Administered 2014-05-04: 3.375 g via INTRAVENOUS
  Filled 2014-05-04: qty 50

## 2014-05-04 MED ORDER — MIRTAZAPINE 15 MG PO TABS
15.0000 mg | ORAL_TABLET | Freq: Every day | ORAL | Status: DC
  Administered 2014-05-04 – 2014-05-08 (×5): 15 mg via ORAL
  Filled 2014-05-04 (×6): qty 1

## 2014-05-04 MED ORDER — PIPERACILLIN-TAZOBACTAM 3.375 G IVPB
3.3750 g | Freq: Three times a day (TID) | INTRAVENOUS | Status: DC
  Administered 2014-05-04 – 2014-05-09 (×14): 3.375 g via INTRAVENOUS
  Filled 2014-05-04 (×15): qty 50

## 2014-05-04 MED ORDER — SODIUM CHLORIDE 0.9 % IV SOLN
INTRAVENOUS | Status: DC
  Administered 2014-05-04 – 2014-05-08 (×5): via INTRAVENOUS

## 2014-05-04 MED ORDER — ACETAMINOPHEN 325 MG PO TABS
650.0000 mg | ORAL_TABLET | Freq: Four times a day (QID) | ORAL | Status: DC | PRN
  Administered 2014-05-06 (×2): 650 mg via ORAL
  Filled 2014-05-04 (×3): qty 2

## 2014-05-04 MED ORDER — MORPHINE SULFATE 2 MG/ML IJ SOLN
2.0000 mg | Freq: Once | INTRAMUSCULAR | Status: AC
  Administered 2014-05-04: 2 mg via INTRAVENOUS
  Filled 2014-05-04: qty 1

## 2014-05-04 MED ORDER — POLYETHYLENE GLYCOL 3350 17 G PO PACK
17.0000 g | PACK | Freq: Every day | ORAL | Status: DC
  Administered 2014-05-04 – 2014-05-05 (×2): 17 g via ORAL
  Filled 2014-05-04 (×4): qty 1

## 2014-05-04 MED ORDER — ONDANSETRON HCL 4 MG/2ML IJ SOLN
4.0000 mg | Freq: Four times a day (QID) | INTRAMUSCULAR | Status: DC | PRN
  Administered 2014-05-04 – 2014-05-06 (×2): 4 mg via INTRAVENOUS
  Filled 2014-05-04 (×2): qty 2

## 2014-05-04 MED ORDER — BISACODYL 10 MG RE SUPP
10.0000 mg | Freq: Every day | RECTAL | Status: DC | PRN
  Filled 2014-05-04: qty 1

## 2014-05-04 MED ORDER — LORAZEPAM 0.5 MG PO TABS
0.5000 mg | ORAL_TABLET | Freq: Two times a day (BID) | ORAL | Status: DC | PRN
  Administered 2014-05-07 – 2014-05-09 (×3): 0.5 mg via ORAL
  Filled 2014-05-04 (×3): qty 1

## 2014-05-04 NOTE — H&P (Signed)
PCP:   Shanda Bumps, MD   Chief Complaint:  Vomiting  HPI:  80 year old male who  has a past medical history of Hypertension; COPD (chronic obstructive pulmonary disease); AAA (abdominal aortic aneurysm); Hyperlipidemia; and Prostate cancer. Patient has history of prostate cancer, and has suprapubic catheter, patient has a history of urostomy and is followed by [facility]. He was supposed to follow-up with them in the clinic to replace the suprapubic catheter. Today patient came to the hospital after he had 2 episodes of vomiting, as per patient there was blood in the vomitus but daughter says that patient rank chocolate ensure this morning and she did not notice any blood in the vomitus. Patient's hemoglobin remained stable at 15.0 Patient also complains of abdominal pain, he recently had shingles and was treated for that. He denies chest pain, no shortness of breath no diarrhea no nausea.  In the ED patient was found to have abnormal UA, worsening BUN/creatinine. Today creatinine is 1.70 his baseline is 1.35.  Allergies:  No Known Allergies    Past Medical History  Diagnosis Date  . Hypertension   . COPD (chronic obstructive pulmonary disease)   . AAA (abdominal aortic aneurysm)   . Hyperlipidemia   . Prostate cancer     Past Surgical History  Procedure Laterality Date  . Transurethral resection of prostate    . Endovascular stent insertion  03/27/08    Endovascular Repair of AAA w/ Gore Graft  . Stomach surgery  1971    peptic ulcer disease    Prior to Admission medications   Medication Sig Start Date End Date Taking? Authorizing Provider  aspirin 81 MG tablet Take 81 mg by mouth daily.    Yes Historical Provider, MD  doxazosin (CARDURA) 8 MG tablet Take 8 mg by mouth at bedtime.    Yes Historical Provider, MD  FLORA-Q [facility]) CAPS capsule Take 1 capsule by mouth 2 (two) times daily.   Yes Historical Provider, MD  Fluticasone-Salmeterol (ADVAIR)  250-50 MCG/DOSE AEPB Inhale 1 puff into the lungs 2 (two) times daily as needed (for shortness of breath and wheezing).    Yes Historical Provider, MD  gabapentin (NEURONTIN) 100 MG capsule Take 1 capsule (100 mg total) by mouth 3 (three) times daily. Patient taking differently: Take 200 mg by mouth 3 (three) times daily.  04/07/14  Yes Virgel Manifold, MD  HYDROcodone-acetaminophen (NORCO/VICODIN) 5-325 MG per tablet Take 1-2 tablets by mouth every 4 (four) hours as needed for moderate pain or severe pain. Patient taking differently: Take 1 tablet by mouth every 8 (eight) hours as needed for moderate pain.  04/07/14  Yes Virgel Manifold, MD  lisinopril (PRINIVIL,ZESTRIL) 20 MG tablet Take 20 mg by mouth daily.     Yes Historical Provider, MD  LORazepam (ATIVAN) 1 MG tablet Take 0.5 tablets (0.5 mg total) by mouth 2 (two) times daily as needed for anxiety or sleep. 04/07/14  Yes Virgel Manifold, MD  mirabegron ER (MYRBETRIQ) 50 MG TB24 tablet Take 50 mg by mouth daily.   Yes Historical Provider, MD  mirtazapine (REMERON) 15 MG tablet Take 15 mg by mouth at bedtime.   Yes Historical Provider, MD  omeprazole (PRILOSEC) 20 MG capsule Take 20 mg by mouth daily.   Yes Historical Provider, MD  propranolol (INDERAL) 20 MG tablet Take 20 mg by mouth 2 (two) times daily.     Yes Historical Provider, MD  Tamsulosin HCl (FLOMAX) 0.4 MG CAPS Take 0.4 mg by mouth  daily.     Yes Historical Provider, MD  verapamil (COVERA HS) 180 MG (CO) 24 hr tablet Take 180 mg by mouth daily.   Yes Historical Provider, MD    Social History:  reports that he quit smoking about 18 years ago. He has never used smokeless tobacco. He reports that he does not drink alcohol or use illicit drugs.  Family History  Problem Relation Age of Onset  . Aneurysm Mother     intracranial aneurysm     All the positives are listed in BOLD  Review of Systems:  HEENT: Headache, blurred vision, runny nose, sore throat Neck: Hypothyroidism,  hyperthyroidism,,lymphadenopathy Chest : Shortness of breath, history of COPD, Asthma Heart : Chest pain, history of coronary arterey disease GI:  Nausea, vomiting, diarrhea, constipation, GERD GU: Dysuria, urgency, frequency of urination, hematuria, suprapubic catheter in place Neuro: Stroke, seizures, syncope Psych: Depression, anxiety, hallucinations   Physical Exam: Blood pressure 102/43, pulse 78, temperature 97.6 F (36.4 C), temperature source Oral, resp. rate 24, SpO2 98 %. Constitutional:   Patient is a malnourished appearing male in no acute distress and cooperative with exam. Head: Normocephalic and atraumatic Mouth: Mucus membranes moist Eyes: PERRL, EOMI, conjunctivae normal Neck: Supple, No Thyromegaly Cardiovascular: RRR, S1 normal, S2 normal Pulmonary/Chest: CTAB, no wheezes, rales, or rhonchi Abdominal: Soft. Positive guarding on palpation, mild diffuse abdominal tenderness, non-distended, bowel sounds are normal, no masses, organomegaly. Neurological: A&O x3, Strength is normal and symmetric bilaterally, cranial nerve II-XII are grossly intact, no focal motor deficit, sensory intact to light touch bilaterally.  Extremities : No Cyanosis, Clubbing or Edema  Labs on Admission:  Basic Metabolic Panel:  Recent Labs Lab 05/04/14 0905 05/04/14 0937  NA 140 138  K 4.7 4.5  CL 102 105  CO2 21  --   GLUCOSE 134* 129*  BUN 31* 32*  CREATININE 1.54* 1.70*  CALCIUM 9.5  --    Liver Function Tests:  Recent Labs Lab 05/04/14 0905  AST 24  ALT 8  ALKPHOS 82  BILITOT 0.8  PROT 6.1  ALBUMIN 3.1*    Recent Labs Lab 05/04/14 0928  LIPASE 21   No results for input(s): AMMONIA in the last 168 hours. CBC:  Recent Labs Lab 05/04/14 0905 05/04/14 0937  WBC 15.4*  --   NEUTROABS 14.1*  --   HGB 13.5 15.0  HCT 42.3 44.0  MCV 85.3  --   PLT 276  --     Radiological Exams on Admission: Ct Abdomen Pelvis Wo Contrast  05/04/2014   CLINICAL DATA:   Abdominal pain. Bloody output from suprapubic catheter.  EXAM: CT ABDOMEN AND PELVIS WITHOUT CONTRAST  TECHNIQUE: Multidetector CT imaging of the abdomen and pelvis was performed following the standard protocol without IV contrast.  COMPARISON:  CT 10/04/2011  FINDINGS: Lower chest: There is extensive emphysema at the lung bases. No change from prior. No pericardial fluid.  Hepatobiliary: No focal hepatic lesion on this non contrast exam. There are several low-density cysts within the liver not changed from prior. No ductal dilatation.  Pancreas: Pancreas is normal. No ductal dilatation. No pancreatic inflammation.  Spleen: Normal spleen.  Adrenals/urinary tract: Adrenal glands are normal. No nephrolithiasis or ureterolithiasis. There bilateral simple fluid attenuation cysts which are not changed from prior.  There is suprapubic catheter in place. The bladder is collapsed. The bladder is anterior and superior to the pubic symphysis.  Stomach/Bowel: Stomach demonstrates a moderate size hiatal hernia. The duodenum and small bowel are normal.  There is a rim of gas along the dependent luminal surface of the cecum and ascending colon. This gas is not clearly evident along the nondependent surface of felt to relate to stool rather than pneumatosis. There is a moderate volume stool in the right colon. Appendix is not identified. The transverse colon is ptotic and extends into the upper pelvis. Moderate volume of stool in the transverse colon. The sigmoid colon is likewise tortuous. No obstructing lesion identified. The rectum is collapsed.  Vascular/Lymphatic: Endovascular repair of the abdominal aortic aneurysm not changed from prior. No retroperitoneal periportal lymphadenopathy.  Reproductive: Prior prostatectomy. No free fluid small amount free fluid the pelvis.  Musculoskeletal: No aggressive osseous lesion.  Other: No abscess or inflammation  IMPRESSION: 1. Suprapubic catheter in place the collapsed bladder which is  positioned superior to the pubic symphysis. No complicating features identified. 2. No hydronephrosis or ureterolithiasis. 3. Moderate volume stool in the right colon and transverse colon suggests constipation. 4. Small amount free fluid the pelvis. 5. Endovascular repair of aortic aneurysm without complicating features.   Electronically Signed   By: Suzy Bouchard M.D.   On: 05/04/2014 10:37   Dg Chest Port 1 View  05/04/2014   CLINICAL DATA:  Abdominal pain.  EXAM: PORTABLE CHEST - 1 VIEW  COMPARISON:  04/07/2014  FINDINGS: The patient has severe chronic interstitial and obstructive lung disease.  There is increased accentuation of the interstitial markings at both lung bases suggesting interstitial edema superimposed on chronic disease. No consolidative infiltrates or effusions. Chronic thoracolumbar scoliosis. No acute osseous abnormality.  IMPRESSION: Possible interstitial edema at the lung bases superimposed on severe emphysema and interstitial lung disease.   Electronically Signed   By: Rozetta Nunnery M.D.   On: 05/04/2014 10:05     Assessment/Plan Principal Problem:   Sepsis Active Problems:   Abdominal pain   Presence of urostomy   Essential hypertension, benign   UTI (urinary tract infection)  Sepsis Patient presented with hypotension, leukocytosis, tachypnea satisfying the criteria for sepsis due to UTI. We'll start vancomycin and Zosyn, urine culture has been obtained. Follow the urine culture and blood culture results.  Abdominal pain CT of the abdomen showed moderate volume of stool in the right colon and transverse colon suggesting constipation, no other acute abnormality seen. We'll start the patient on MiraLAX 17 g by mouth daily. Dulcolax suppository when necessary.  History of urostomy, suprapubic catheter Patient has suprapubic catheter in place, will place order to replace the suprapubic catheter by nursing staff  History of recent herpes zoster Patient does not have  any active lesions, he was treated recently for herpes zoster.  Acute kidney injury Patient has worsening BUN/creatinine, will start IV normal saline at 75 mL per hour, follow BMP in a.m.  ? Hematemesis Patient had 2 episodes of vomiting this morning. He complains of blood in the vomitus, though no blood was noticed by patient's daughter. We'll check H&H every 8 hours, and occult blood if he vomits again.  DVT prophylaxis SCDs  Code status: Patient is DO NOT RESUSCITATE  Family discussion: Admission, patients condition and plan of care including tests being ordered have been discussed with the patient and *his daughter and son at bedside* who indicate understanding and agree with the plan and Code Status.   Time Spent on Admission: 60 minutes  [facility] Pager: [phone] 05/04/2014, 12:42 PM  If 7PM-7AM, please contact night-coverage  [email or web address]  Password [email or web address]

## 2014-05-04 NOTE — Progress Notes (Signed)
ANTIBIOTIC CONSULT NOTE - INITIAL  Pharmacy Consult for vancomycin, Zosyn Indication: rule out sepsis  No Known Allergies  Patient Measurements: Weight: 115 lb 1.3 oz (52.2 kg)  Vital Signs: Temp: 97.6 F (36.4 C) (12/07 0849) Temp Source: Oral (12/07 0849) BP: 102/43 mmHg (12/07 1123) Pulse Rate: 78 (12/07 1123) Intake/Output from previous day:   Intake/Output from this shift:    Labs:  Recent Labs  05/04/14 0905 05/04/14 0937  WBC 15.4*  --   HGB 13.5 15.0  PLT 276  --   CREATININE 1.54* 1.70*   CrCl cannot be calculated (Unknown ideal weight.). No results for input(s): VANCOTROUGH, VANCOPEAK, VANCORANDOM, GENTTROUGH, GENTPEAK, GENTRANDOM, TOBRATROUGH, TOBRAPEAK, TOBRARND, AMIKACINPEAK, AMIKACINTROU, AMIKACIN in the last 72 hours.   Microbiology: No results found for this or any previous visit (from the past 720 hour(s)).  Medical History: Past Medical History  Diagnosis Date  . Hypertension   . COPD (chronic obstructive pulmonary disease)   . AAA (abdominal aortic aneurysm)   . Hyperlipidemia   . Prostate cancer     Medications:  Scheduled:  . gabapentin  100 mg Oral TID  . mirabegron ER  50 mg Oral Daily  . mirtazapine  15 mg Oral QHS  . piperacillin-tazobactam (ZOSYN)  IV  3.375 g Intravenous Once   Infusions:  . sodium chloride     Assessment: 80 yo male presented to ER with CC hematemesis and diffuse abdominal pain since yesterday. Patient s/p urostomy placement also with bloody output since yesterday. PMH HTN, COPD, AAA, HLP, prostate CA s/p TURP. To start vancomycin and Zosyn per pharmacy dosing for UTI rule out sepsis.  12/7 >> vancomycin >> 12/7 >> Zosyn >>   Tmax: afebrile WBCs: 15.4 Renal: SCr 1.7, CrCl 25 CG and 34 N  12/7 urine culture:  Goal of Therapy:  Vancomycin trough level 15-20 mcg/ml  Plan:  1) Vancomycin 500mg  IV q24 per current weight and renal function 2) Zosyn 3.375g IV q8 (extended interval infusion) for CrCl > 20  ml/min   Adrian Saran, PharmD, BCPS Pager [phone] 05/04/2014 12:58 PM

## 2014-05-04 NOTE — ED Notes (Signed)
Pt coming from home via EMS, c/o ABD pain, pt has a urostomy that has also had some bloody output the last day. Pt is DNR with golden rod paper present.

## 2014-05-04 NOTE — ED Provider Notes (Signed)
CSN: [id number]     Arrival date & time 05/04/14  4270 History   First MD Initiated Contact with Patient 05/04/14 [phone]     No chief complaint on file.    HPI Pt presents with complaint of hematemesis.  States he has been vomiting a large amount of dark red blood with clots since last night.  Denies hematochezia or melena.  Also has diffuse abdominal pain that started yesterday.  Sxs are severe, constant, and worsening.  Family is unsure if pt truly had hematemesis - they think that his vomitus may have been ensure. No hx/o fevers.  He was admitted to another hospital a few weeks ago for chest pain and pna.  He has a hx/o AAA repair.   Past Medical History  Diagnosis Date  . Hypertension   . COPD (chronic obstructive pulmonary disease)   . AAA (abdominal aortic aneurysm)   . Hyperlipidemia   . Prostate cancer    Past Surgical History  Procedure Laterality Date  . Transurethral resection of prostate    . Endovascular stent insertion  03/27/08    Endovascular Repair of AAA w/ Gore Graft  . Stomach surgery  1971    peptic ulcer disease   Family History  Problem Relation Age of Onset  . Aneurysm Mother     intracranial aneurysm   History  Substance Use Topics  . Smoking status: Former Smoker    Quit date: 05/02/1996  . Smokeless tobacco: Never Used  . Alcohol Use: No    Review of Systems  All other systems reviewed and are negative.     Allergies  Review of patient's allergies indicates no known allergies.  Home Medications   Prior to Admission medications   Medication Sig Start Date End Date Taking? Authorizing Provider  amoxicillin-clavulanate (AUGMENTIN) 875-125 MG per tablet Take 1 tablet by mouth 2 (two) times daily. For 7 days 03/31/14   Historical Provider, MD  aspirin 81 MG tablet Take 81 mg by mouth daily as needed for pain.    Historical Provider, MD  doxazosin (CARDURA) 8 MG tablet Take 8 mg by mouth daily.    Historical Provider, MD  FLORA-Q [facility]) CAPS  capsule Take 1 capsule by mouth 2 (two) times daily.    Historical Provider, MD  Fluticasone-Salmeterol (ADVAIR) 250-50 MCG/DOSE AEPB Inhale 1 puff into the lungs 2 (two) times daily.    Historical Provider, MD  gabapentin (NEURONTIN) 100 MG capsule Take 1 capsule (100 mg total) by mouth 3 (three) times daily. 04/07/14   Virgel Manifold, MD  HYDROcodone-acetaminophen (NORCO/VICODIN) 5-325 MG per tablet Take 1-2 tablets by mouth every 4 (four) hours as needed for moderate pain or severe pain. 04/07/14   Virgel Manifold, MD  levofloxacin (LEVAQUIN) 750 MG tablet Take 750 mg by mouth every other day. For 10 days 03/31/14   Historical Provider, MD  lisinopril (PRINIVIL,ZESTRIL) 20 MG tablet Take 20 mg by mouth daily.      Historical Provider, MD  LORazepam (ATIVAN) 1 MG tablet Take 0.5 tablets (0.5 mg total) by mouth 2 (two) times daily as needed for anxiety or sleep. 04/07/14   Virgel Manifold, MD  mirabegron ER (MYRBETRIQ) 50 MG TB24 tablet Take 50 mg by mouth daily.    Historical Provider, MD  mirtazapine (REMERON) 15 MG tablet Take 15 mg by mouth at bedtime.    Historical Provider, MD  omeprazole (PRILOSEC) 20 MG capsule Take 20 mg by mouth daily.    Historical Provider, MD  pantoprazole (  PROTONIX) 40 MG tablet Take 40 mg by mouth daily.      Historical Provider, MD  predniSONE (DELTASONE) 20 MG tablet Take 40 mg by mouth daily with breakfast.    Historical Provider, MD  propranolol (INDERAL) 20 MG tablet Take 20 mg by mouth 2 (two) times daily.      Historical Provider, MD  Tamsulosin HCl (FLOMAX) 0.4 MG CAPS Take 0.4 mg by mouth daily.      Historical Provider, MD  traMADol (ULTRAM) 50 MG tablet Take 50 mg by mouth every 6 (six) hours as needed for moderate pain.    Historical Provider, MD  traZODone (DESYREL) 150 MG tablet Take 150 mg by mouth daily.      Historical Provider, MD  verapamil (COVERA HS) 180 MG (CO) 24 hr tablet Take 180 mg by mouth daily.    Historical Provider, MD  verapamil (COVERA HS)  240 MG (CO) 24 hr tablet Take 240 mg by mouth daily.      Historical Provider, MD   BP 84/53 mmHg  Pulse 74  Temp(Src) 97.6 F (36.4 C) (Oral)  Resp 17  SpO2 92% Physical Exam  Constitutional: He is oriented to person, place, and time.  Frail, chronically ill appearing  HENT:  Head: Normocephalic and atraumatic.  Dry mucous membranes  Cardiovascular: Normal rate.   No murmur heard. Pulmonary/Chest: Effort normal. No respiratory distress.  Decreased air movement bilaterally.  There is a large amount of ecchymosis to the right chest wall.   Abdominal: Soft.  Suprapubic catheter in place.  Moderate diffuse tenderness with voluntary guarding, no rebound.   Genitourinary:  Brown stool, heme negative  Musculoskeletal: He exhibits no edema or tenderness.  Neurological: He is alert and oriented to person, place, and time.  Skin: Skin is warm and dry.  Psychiatric: He has a normal mood and affect. His behavior is normal.  Nursing note and vitals reviewed.   ED Course  Procedures (including critical care time) Labs Review Labs Reviewed  COMPREHENSIVE METABOLIC PANEL - Abnormal; Notable for the following:    Glucose, Bld 134 (*)    BUN 31 (*)    Creatinine, Ser 1.54 (*)    Albumin 3.1 (*)    GFR calc non Af Amer 41 (*)    GFR calc Af Amer 47 (*)    Anion gap 17 (*)    All other components within normal limits  CBC WITH DIFFERENTIAL - Abnormal; Notable for the following:    WBC 15.4 (*)    RDW 16.8 (*)    Neutrophils Relative % 91 (*)    Neutro Abs 14.1 (*)    Lymphocytes Relative 3 (*)    Lymphs Abs 0.4 (*)    All other components within normal limits  URINALYSIS, ROUTINE W REFLEX MICROSCOPIC - Abnormal; Notable for the following:    Color, Urine RED (*)    APPearance CLOUDY (*)    Bilirubin Urine LARGE (*)    Ketones, ur 40 (*)    Protein, ur 30 (*)    Urobilinogen, UA 4.0 (*)    Nitrite POSITIVE (*)    Leukocytes, UA LARGE (*)    All other components within normal  limits  URINE MICROSCOPIC-ADD ON - Abnormal; Notable for the following:    Bacteria, UA MANY (*)    All other components within normal limits  I-STAT CG4 LACTIC ACID, ED - Abnormal; Notable for the following:    Lactic Acid, Venous 3.81 (*)    All  other components within normal limits  I-STAT CHEM 8, ED - Abnormal; Notable for the following:    BUN 32 (*)    Creatinine, Ser 1.70 (*)    Glucose, Bld 129 (*)    All other components within normal limits  URINE CULTURE  MRSA PCR SCREENING  PROTIME-INR  LIPASE, BLOOD  TYPE AND SCREEN  ABO/RH    Imaging Review Ct Abdomen Pelvis Wo Contrast  05/04/2014   CLINICAL DATA:  Abdominal pain. Bloody output from suprapubic catheter.  EXAM: CT ABDOMEN AND PELVIS WITHOUT CONTRAST  TECHNIQUE: Multidetector CT imaging of the abdomen and pelvis was performed following the standard protocol without IV contrast.  COMPARISON:  CT 10/04/2011  FINDINGS: Lower chest: There is extensive emphysema at the lung bases. No change from prior. No pericardial fluid.  Hepatobiliary: No focal hepatic lesion on this non contrast exam. There are several low-density cysts within the liver not changed from prior. No ductal dilatation.  Pancreas: Pancreas is normal. No ductal dilatation. No pancreatic inflammation.  Spleen: Normal spleen.  Adrenals/urinary tract: Adrenal glands are normal. No nephrolithiasis or ureterolithiasis. There bilateral simple fluid attenuation cysts which are not changed from prior.  There is suprapubic catheter in place. The bladder is collapsed. The bladder is anterior and superior to the pubic symphysis.  Stomach/Bowel: Stomach demonstrates a moderate size hiatal hernia. The duodenum and small bowel are normal. There is a rim of gas along the dependent luminal surface of the cecum and ascending colon. This gas is not clearly evident along the nondependent surface of felt to relate to stool rather than pneumatosis. There is a moderate volume stool in the  right colon. Appendix is not identified. The transverse colon is ptotic and extends into the upper pelvis. Moderate volume of stool in the transverse colon. The sigmoid colon is likewise tortuous. No obstructing lesion identified. The rectum is collapsed.  Vascular/Lymphatic: Endovascular repair of the abdominal aortic aneurysm not changed from prior. No retroperitoneal periportal lymphadenopathy.  Reproductive: Prior prostatectomy. No free fluid small amount free fluid the pelvis.  Musculoskeletal: No aggressive osseous lesion.  Other: No abscess or inflammation  IMPRESSION: 1. Suprapubic catheter in place the collapsed bladder which is positioned superior to the pubic symphysis. No complicating features identified. 2. No hydronephrosis or ureterolithiasis. 3. Moderate volume stool in the right colon and transverse colon suggests constipation. 4. Small amount free fluid the pelvis. 5. Endovascular repair of aortic aneurysm without complicating features.   Electronically Signed   By: Suzy Bouchard M.D.   On: 05/04/2014 10:37   Dg Chest Port 1 View  05/04/2014   CLINICAL DATA:  Abdominal pain.  EXAM: PORTABLE CHEST - 1 VIEW  COMPARISON:  04/07/2014  FINDINGS: The patient has severe chronic interstitial and obstructive lung disease.  There is increased accentuation of the interstitial markings at both lung bases suggesting interstitial edema superimposed on chronic disease. No consolidative infiltrates or effusions. Chronic thoracolumbar scoliosis. No acute osseous abnormality.  IMPRESSION: Possible interstitial edema at the lung bases superimposed on severe emphysema and interstitial lung disease.   Electronically Signed   By: Rozetta Nunnery M.D.   On: 05/04/2014 10:05     EKG Interpretation None      MDM   Final diagnoses:  Generalized abdominal pain  UTI (lower urinary tract infection)   Pt here for evaluation of abdominal pain and vomiting. There is a question of whether or not patient had  hematemesis at home. Patient has brown stool that is heme  negative in the emergency department. Patient hypotensive upon ED arrival, this improved with IV fluid hydration. Initial concern for possible ruptured AAA versus perforated viscus. Noncontrasted CT scan without evidence of ruptured AAA or perforated viscus. UA is consistent with UTI patient treated with Zosyn, with initial concern for possible acute abdominal process. Zosyn should cover urinary tract infection as well. This with medicein admission for further management. Patient is DNR.  He does want evaluation and treatment for treatable conditions.      Quintella Reichert, MD 05/04/14 [phone]

## 2014-05-04 NOTE — ED Notes (Signed)
Bed: GF94 Expected date:  Expected time:  Means of arrival:  Comments: 16m abd pain

## 2014-05-05 DIAGNOSIS — E43 Unspecified severe protein-calorie malnutrition: Secondary | ICD-10-CM | POA: Insufficient documentation

## 2014-05-05 LAB — COMPREHENSIVE METABOLIC PANEL
ALBUMIN: 2.3 g/dL — AB (ref 3.5–5.2)
ALT: 7 U/L (ref 0–53)
AST: 20 U/L (ref 0–37)
Alkaline Phosphatase: 61 U/L (ref 39–117)
Anion gap: 13 (ref 5–15)
BILIRUBIN TOTAL: 0.4 mg/dL (ref 0.3–1.2)
BUN: 43 mg/dL — ABNORMAL HIGH (ref 6–23)
CHLORIDE: 103 meq/L (ref 96–112)
CO2: 22 mEq/L (ref 19–32)
CREATININE: 1.7 mg/dL — AB (ref 0.50–1.35)
Calcium: 8.3 mg/dL — ABNORMAL LOW (ref 8.4–10.5)
GFR calc Af Amer: 42 mL/min — ABNORMAL LOW (ref >90)
GFR, EST NON AFRICAN AMERICAN: 36 mL/min — AB (ref >90)
Glucose, Bld: 111 mg/dL — ABNORMAL HIGH (ref 70–99)
Potassium: 4.9 mEq/L (ref 3.7–5.3)
Sodium: 138 mEq/L (ref 137–147)
Total Protein: 5 g/dL — ABNORMAL LOW (ref 6.0–8.3)

## 2014-05-05 LAB — CBC
HCT: 37.4 % — ABNORMAL LOW (ref 39.0–52.0)
Hemoglobin: 12.1 g/dL — ABNORMAL LOW (ref 13.0–17.0)
MCH: 27.3 pg (ref 26.0–34.0)
MCHC: 32.4 g/dL (ref 30.0–36.0)
MCV: 84.2 fL (ref 78.0–100.0)
PLATELETS: 218 10*3/uL (ref 150–400)
RBC: 4.44 MIL/uL (ref 4.22–5.81)
RDW: 17.2 % — ABNORMAL HIGH (ref 11.5–15.5)
WBC: 11.9 10*3/uL — AB (ref 4.0–10.5)

## 2014-05-05 MED ORDER — SODIUM CHLORIDE 0.9 % IV BOLUS (SEPSIS)
500.0000 mL | Freq: Once | INTRAVENOUS | Status: AC
  Administered 2014-05-05: 500 mL via INTRAVENOUS

## 2014-05-05 NOTE — Progress Notes (Signed)
INITIAL NUTRITION ASSESSMENT  DOCUMENTATION CODES Per approved criteria  -Severe malnutrition in the context of chronic illness -Underweight  Pt meets criteria for severe MALNUTRITION in the context of chronic illness as evidenced by PO intake < 75% for > one month, severe muscle wasting and subcutaneous fat loss.   INTERVENTION: -Diet advancement per MD -Recommend Ensure Complete once daily (with goal of Ensure Complete po BID, each supplement provides 350 kcal and 13 grams of protein) -Encouraged intake and addition of high kcal/protein snacks as tolerated -Recommend addition of anti-emetic -RD to continue to monitor   NUTRITION DIAGNOSIS: Inadequate oral intake related to nausea/vomiting/abd pain as evidenced by PO intake < 75%, wt loss.   Goal: Pt to meet >/= 90% of their estimated nutrition needs    Monitor:  Diet order, GI profile, total protein/energy intake, labs, weights  Reason for Assessment: MST/Underweight BMI  80 y.o. male  Admitting Dx: Sepsis  ASSESSMENT: 80 year old male who  has a past medical history of Hypertension; COPD (chronic obstructive pulmonary disease); AAA (abdominal aortic aneurysm); Hyperlipidemia; and Prostate cancer. Patient has history of prostate cancer, and has suprapubic catheter, patient has a history of urostomy and is followed by [facility]. He was supposed to follow-up with them in the clinic to replace the suprapubic catheter. Today patient came to the hospital after he had 2 episodes of vomiting, as per patient there was blood in the vomitus but daughter says that patient rank chocolate ensure this morning and she did not notice any blood in the vomitus  -Pt reported ongoing decreased appetite and weight loss d/t nausea, vomiting and abd pain -Diet recall indicates pt unable to tolerate full meals, and usually consumes snacks and one chocolate Ensure daily -Endorsed weight loss, unable to quantify amount. Severe muscle wasting,  subcutaneous fat loss, sub-optimal nutrient intake and underweight status are indicative of a significant weight loss. RD to continue to monitor -Tolerating clear liquid diet, consuming < 50%. Pt being conservative of intake to minimize nausea.  -Recommend antiemetic. Receiving Miralax to assist with abd pain/constipation, which will likely improve appetite once relieved -Was willing to consume Ensure Complete once daily, with possible increase to BID as tolerated -Declined clear liquid diet supplements -Renal profile elevated d/t AKI/dehydration  Height: Ht Readings from Last 1 Encounters:  05/04/14 5\' 11"  (1.803 m)    Weight: Wt Readings from Last 1 Encounters:  05/05/14 119 lb 0.8 oz (54 kg)    Ideal Body Weight: 172 lb  % Ideal Body Weight: 69%  Wt Readings from Last 10 Encounters:  05/05/14 119 lb 0.8 oz (54 kg)    Usual Body Weight: unable to determine  % Usual Body Weight: unable to determine-RD to readdress on follow up  BMI:  Body mass index is 16.61 kg/(m^2).  Estimated Nutritional Needs: Kcal: 1650-1850 Protein: 80-90 gram Fluid: >/= 1700 ml daily  Skin: WDL  Diet Order: Diet clear liquid  EDUCATION NEEDS: -Education needs addressed   Intake/Output Summary (Last 24 hours) at 05/05/14 1125 Last data filed at 05/05/14 0900  Gross per 24 hour  Intake 2229.17 ml  Output    420 ml  Net 1809.17 ml    Last BM: 12/06   Labs:   Recent Labs Lab 05/04/14 0905 05/04/14 0937 05/05/14 0327  NA 140 138 138  K 4.7 4.5 4.9  CL 102 105 103  CO2 21  --  22  BUN 31* 32* 43*  CREATININE 1.54* 1.70* 1.70*  CALCIUM 9.5  --  8.3*  GLUCOSE 134* 129* 111*    CBG (last 3)  No results for input(s): GLUCAP in the last 72 hours.  Scheduled Meds: . gabapentin  100 mg Oral TID  . mirabegron ER  50 mg Oral Daily  . mirtazapine  15 mg Oral QHS  . piperacillin-tazobactam (ZOSYN)  IV  3.375 g Intravenous Q8H  . polyethylene glycol  17 g Oral Daily  . vancomycin   500 mg Intravenous Q24H    Continuous Infusions: . sodium chloride 100 mL/hr at 05/05/14 0900    Past Medical History  Diagnosis Date  . Hypertension   . COPD (chronic obstructive pulmonary disease)   . AAA (abdominal aortic aneurysm)   . Hyperlipidemia   . Prostate cancer     Past Surgical History  Procedure Laterality Date  . Transurethral resection of prostate    . Endovascular stent insertion  03/27/08    Endovascular Repair of AAA w/ Gore Graft  . Stomach surgery  1971    peptic ulcer disease    Atlee Abide MS RD LDN Clinical Dietitian [phone]

## 2014-05-05 NOTE — Progress Notes (Signed)
CARE MANAGEMENT NOTE 05/05/2014  Patient:  DAYLN, TUGWELL   Account Number:  [id number]  Date Initiated:  05/05/2014  Documentation initiated by:  DAVIS,RHONDA  Subjective/Objective Assessment:   pt with poss sepsis and hypotension     Action/Plan:   home when stable   Anticipated DC Date:  05/08/2014   Anticipated DC Plan:  HOME/SELF CARE  In-house referral  NA      DC Planning Services  NA      [facility] Choice  NA   Choice offered to / List presented to:  NA   DME arranged  NA      DME agency  NA     [place] arranged  NA      [place] agency  NA   Status of service:  In process, will continue to follow Medicare Important Message given?   (If response is "NO", the following Medicare IM given date fields will be blank) Date Medicare IM given:   Medicare IM given by:   Date Additional Medicare IM given:   Additional Medicare IM given by:    Discharge Disposition:    Per UR Regulation:  Reviewed for med. necessity/level of care/duration of stay  If discussed at [place] of Stay Meetings, dates discussed:    Comments:  [id number]/Rhonda Davis,RN,BSN,CCM:

## 2014-05-05 NOTE — Progress Notes (Signed)
[facility] PROGRESS NOTE  Bill [name] UVO:[id number] DOB: 11-27-1930 DOA: 05/04/2014 PCP: Shanda Bumps, MD  Assessment/Plan:  Sepsis Patient presented with hypotension, leukocytosis, tachypnea satisfying the criteria for sepsis due to UTI. Was started on  vancomycin and Zosyn, urine culture has been obtained. Follow the urine culture  results.  Abdominal pain CT of the abdomen showed moderate volume of stool in the right colon and transverse colon suggesting constipation, no other acute abnormality seen. We'll start the patient on MiraLAX 17 g by mouth daily. Dulcolax suppository when necessary.  History of urostomy, suprapubic catheter Patient has suprapubic catheter in place, was  Replaced by  the suprapubic catheter by nursing staff  History of recent herpes zoster Patient does not have any active lesions, he was treated recently for herpes zoster.  Acute kidney injury Patient has worsening BUN/creatinine, will start IV normal saline at 75 mL per hour, follow BMP in a.m.  ? Hematemesis Patient had 2 episodes of vomiting this morning. He complains of blood in the vomitus, though no blood was noticed by patient's daughter. We'll check H&H every 8 hours, and occult blood if he vomits again.  DVT prophylaxis SCDs  Code Status: DNR Family Communication: *Discussed with daughter at bedside Disposition Plan: To be decided   Consultants:  None  Procedures:  None  Antibiotics:  *Zosyn 12/7  Vancomycin 12/7  HPI/Subjective: 80 year old male who  has a past medical history of Hypertension; COPD (chronic obstructive pulmonary disease); AAA (abdominal aortic aneurysm); Hyperlipidemia; and Prostate cancer Patient has history of prostate cancer, and has suprapubic catheter, patient has a history of urostomy and is followed by [facility]. He was supposed to follow-up with them in the clinic to replace the suprapubic catheter. Today patient came to the  hospital after he had 2 episodes of vomiting, as per patient there was blood in the vomitus but daughter says that patient rank chocolate ensure this morning and she did not notice any blood in the vomitus. Patient's hemoglobin remained stable at 15.0 Patient also complains of abdominal pain, he recently had shingles and was treated for that. He denies chest pain, no shortness of breath no diarrhea no nausea.  Today he feels better, suprapubic catheter was changed yesterday.  Objective: Filed Vitals:   05/05/14 1400  BP: 131/56  Pulse: 93  Temp:   Resp: 23    Intake/Output Summary (Last 24 hours) at 05/05/14 1616 Last data filed at 05/05/14 1530  Gross per 24 hour  Intake 2659.17 ml  Output    525 ml  Net 2134.17 ml   Filed Weights   05/04/14 1123 05/04/14 1338 05/05/14 0400  Weight: 52.2 kg (115 lb 1.3 oz) 52.2 kg (115 lb 1.3 oz) 54 kg (119 lb 0.8 oz)    Exam:  Physical Exam: Eyes: No icterus, extraocular muscles intact  Lungs: Normal respiratory effort, bilateral clear to auscultation, no crackles or wheezes.  Heart: Regular rate and rhythm, S1 and S2 normal, no murmurs, rubs auscultated Abdomen: BS normoactive,soft,nondistended,non-tender to palpation,no organomegaly Extremities: No pretibial edema, no erythema, no cyanosis, no clubbing Neuro : Alert and oriented to time, place and person, No focal deficits Skin: No rashes seen on exam  Data Reviewed: Basic Metabolic Panel:  Recent Labs Lab 05/04/14 0905 05/04/14 0937 05/05/14 0327  NA 140 138 138  K 4.7 4.5 4.9  CL 102 105 103  CO2 21  --  22  GLUCOSE 134* 129* 111*  BUN 31* 32* 43*  CREATININE  1.54* 1.70* 1.70*  CALCIUM 9.5  --  8.3*   Liver Function Tests:  Recent Labs Lab 05/04/14 0905 05/05/14 0327  AST 24 20  ALT 8 7  ALKPHOS 82 61  BILITOT 0.8 0.4  PROT 6.1 5.0*  ALBUMIN 3.1* 2.3*    Recent Labs Lab 05/04/14 0928  LIPASE 21   No results for input(s): AMMONIA in the last 168  hours. CBC:  Recent Labs Lab 05/04/14 0905 05/04/14 0937 05/05/14 0327  WBC 15.4*  --  11.9*  NEUTROABS 14.1*  --   --   HGB 13.5 15.0 12.1*  HCT 42.3 44.0 37.4*  MCV 85.3  --  84.2  PLT 276  --  218   Cardiac Enzymes: No results for input(s): CKTOTAL, CKMB, CKMBINDEX, TROPONINI in the last 168 hours. BNP (last 3 results) No results for input(s): PROBNP in the last 8760 hours. CBG: No results for input(s): GLUCAP in the last 168 hours.  Recent Results (from the past 240 hour(s))  Urine culture     Status: None (Preliminary result)   Collection Time: 05/04/14 10:45 AM  Result Value Ref Range Status   Specimen Description URINE, CATHETERIZED  Final   Special Requests NONE  Final   Culture  Setup Time   Final    05/04/2014 13:03 Performed at [place] PENDING  Incomplete   Culture   Final    Culture reincubated for better growth Performed at [facility]    Report Status PENDING  Incomplete     Studies: Ct Abdomen Pelvis Wo Contrast  05/04/2014   CLINICAL DATA:  Abdominal pain. Bloody output from suprapubic catheter.  EXAM: CT ABDOMEN AND PELVIS WITHOUT CONTRAST  TECHNIQUE: Multidetector CT imaging of the abdomen and pelvis was performed following the standard protocol without IV contrast.  COMPARISON:  CT 10/04/2011  FINDINGS: Lower chest: There is extensive emphysema at the lung bases. No change from prior. No pericardial fluid.  Hepatobiliary: No focal hepatic lesion on this non contrast exam. There are several low-density cysts within the liver not changed from prior. No ductal dilatation.  Pancreas: Pancreas is normal. No ductal dilatation. No pancreatic inflammation.  Spleen: Normal spleen.  Adrenals/urinary tract: Adrenal glands are normal. No nephrolithiasis or ureterolithiasis. There bilateral simple fluid attenuation cysts which are not changed from prior.  There is suprapubic catheter in place. The bladder is collapsed. The bladder  is anterior and superior to the pubic symphysis.  Stomach/Bowel: Stomach demonstrates a moderate size hiatal hernia. The duodenum and small bowel are normal. There is a rim of gas along the dependent luminal surface of the cecum and ascending colon. This gas is not clearly evident along the nondependent surface of felt to relate to stool rather than pneumatosis. There is a moderate volume stool in the right colon. Appendix is not identified. The transverse colon is ptotic and extends into the upper pelvis. Moderate volume of stool in the transverse colon. The sigmoid colon is likewise tortuous. No obstructing lesion identified. The rectum is collapsed.  Vascular/Lymphatic: Endovascular repair of the abdominal aortic aneurysm not changed from prior. No retroperitoneal periportal lymphadenopathy.  Reproductive: Prior prostatectomy. No free fluid small amount free fluid the pelvis.  Musculoskeletal: No aggressive osseous lesion.  Other: No abscess or inflammation  IMPRESSION: 1. Suprapubic catheter in place the collapsed bladder which is positioned superior to the pubic symphysis. No complicating features identified. 2. No hydronephrosis or ureterolithiasis. 3. Moderate volume stool in the  right colon and transverse colon suggests constipation. 4. Small amount free fluid the pelvis. 5. Endovascular repair of aortic aneurysm without complicating features.   Electronically Signed   By: Suzy Bouchard M.D.   On: 05/04/2014 10:37   Dg Chest Port 1 View  05/04/2014   CLINICAL DATA:  Abdominal pain.  EXAM: PORTABLE CHEST - 1 VIEW  COMPARISON:  04/07/2014  FINDINGS: The patient has severe chronic interstitial and obstructive lung disease.  There is increased accentuation of the interstitial markings at both lung bases suggesting interstitial edema superimposed on chronic disease. No consolidative infiltrates or effusions. Chronic thoracolumbar scoliosis. No acute osseous abnormality.  IMPRESSION: Possible interstitial  edema at the lung bases superimposed on severe emphysema and interstitial lung disease.   Electronically Signed   By: Rozetta Nunnery M.D.   On: 05/04/2014 10:05    Scheduled Meds: . gabapentin  100 mg Oral TID  . mirabegron ER  50 mg Oral Daily  . mirtazapine  15 mg Oral QHS  . piperacillin-tazobactam (ZOSYN)  IV  3.375 g Intravenous Q8H  . polyethylene glycol  17 g Oral Daily  . vancomycin  500 mg Intravenous Q24H   Continuous Infusions: . sodium chloride 100 mL/hr at 05/05/14 1500    Principal Problem:   Sepsis Active Problems:   Abdominal pain   Presence of urostomy   Essential hypertension, benign   UTI (urinary tract infection)   Protein-calorie malnutrition, severe    Time spent: 25 min    [facility] Pager [phone] 7PM-7AM, please contact night-coverage at [email or web address], password [facility] 05/05/2014, 4:16 PM  LOS: 1 day

## 2014-05-06 ENCOUNTER — Inpatient Hospital Stay (HOSPITAL_COMMUNITY): Payer: Medicare Other

## 2014-05-06 DIAGNOSIS — E43 Unspecified severe protein-calorie malnutrition: Secondary | ICD-10-CM

## 2014-05-06 DIAGNOSIS — R1084 Generalized abdominal pain: Secondary | ICD-10-CM | POA: Insufficient documentation

## 2014-05-06 DIAGNOSIS — N390 Urinary tract infection, site not specified: Secondary | ICD-10-CM | POA: Insufficient documentation

## 2014-05-06 LAB — CBC
HEMATOCRIT: 34.1 % — AB (ref 39.0–52.0)
Hemoglobin: 10.7 g/dL — ABNORMAL LOW (ref 13.0–17.0)
MCH: 26.9 pg (ref 26.0–34.0)
MCHC: 31.4 g/dL (ref 30.0–36.0)
MCV: 85.7 fL (ref 78.0–100.0)
PLATELETS: 164 10*3/uL (ref 150–400)
RBC: 3.98 MIL/uL — ABNORMAL LOW (ref 4.22–5.81)
RDW: 17.4 % — ABNORMAL HIGH (ref 11.5–15.5)
WBC: 8.8 10*3/uL (ref 4.0–10.5)

## 2014-05-06 LAB — BASIC METABOLIC PANEL
Anion gap: 10 (ref 5–15)
BUN: 43 mg/dL — AB (ref 6–23)
CALCIUM: 8 mg/dL — AB (ref 8.4–10.5)
CHLORIDE: 109 meq/L (ref 96–112)
CO2: 22 mEq/L (ref 19–32)
CREATININE: 1.49 mg/dL — AB (ref 0.50–1.35)
GFR calc non Af Amer: 42 mL/min — ABNORMAL LOW (ref >90)
GFR, EST AFRICAN AMERICAN: 49 mL/min — AB (ref >90)
Glucose, Bld: 106 mg/dL — ABNORMAL HIGH (ref 70–99)
Potassium: 4.5 mEq/L (ref 3.7–5.3)
Sodium: 141 mEq/L (ref 137–147)

## 2014-05-06 LAB — URINE CULTURE: Colony Count: >=100000

## 2014-05-06 LAB — MRSA PCR SCREENING: MRSA by PCR: NEGATIVE

## 2014-05-06 MED ORDER — ENSURE COMPLETE PO LIQD
237.0000 mL | Freq: Two times a day (BID) | ORAL | Status: DC
  Administered 2014-05-07: 237 mL via ORAL

## 2014-05-06 NOTE — Progress Notes (Signed)
Received report from [facility], Pt arrived unit accompanied by family. Md notified of Pt's location, will continue with current plan of care.

## 2014-05-06 NOTE — Plan of Care (Signed)
Problem: Consults Goal: UTI/Pyelonephritis Patient Education See Patient Education Module for education specifics.  Outcome: Progressing Goal: Skin Care Protocol Initiated - if Braden Score 18 or less If consults are not indicated, leave blank or document N/A  Outcome: Progressing Goal: Nutrition Consult-if indicated Outcome: Not Applicable Date Met:  94/50/38 Goal: Diabetes Guidelines if Diabetic/Glucose > 140 If diabetic or lab glucose is > 140 mg/dl - Initiate Diabetes/Hyperglycemia Guidelines & Document Interventions  Outcome: Not Applicable Date Met:  88/28/00  Problem: Phase I Progression Outcomes Goal: Pain controlled with appropriate interventions Outcome: Progressing Goal: Tolerating diet Outcome: Completed/Met Date Met:  05/06/14 Goal: Hemodynamically stable Outcome: Progressing

## 2014-05-06 NOTE — Progress Notes (Signed)
PROGRESS NOTE  Bill [name] LHT:[id number] DOB: 1930-11-08 DOA: 05/04/2014 PCP: Shanda Bumps, MD  Assessment/Plan: Sepsis -Present at the time of admission -Secondary to HCAP and UTI -Continue empiric vancomycin and Zosyn pending culture data UTI  -Patient has chronic indwelling suprapubic catheter  -Suprapubic catheter changed 05/05/2014  -Continue empiric antibiotics pending culture data  HCAP/Aspiration pneumonitis -Chest x-ray reveals bibasilar infiltrates which are new when compared to a chest x-ray on 04/07/2014  -Continue empiric antibiotics pending culture data  -Patient was recently discharged from [facility] with PNA Abdominal pain  -Suspect may be due to constipation as CT revealed moderate stool in the colon  -[place]  -2 view abdomen x-ray  AKI -secondary to sepsis/ATN and volume depletion in setting of vomiting -improving slowly -judicious IVF -d/c lisinopril postherpetic neuralgia  -Continue Norco and gabapentin  Severe protein calorie malnutrition  -Ensure complete     Family Communication:   Daughter updated at beside Disposition Plan:   Home when medically stable       Procedures/Studies: Ct Abdomen Pelvis Wo Contrast  05/04/2014   CLINICAL DATA:  Abdominal pain. Bloody output from suprapubic catheter.  EXAM: CT ABDOMEN AND PELVIS WITHOUT CONTRAST  TECHNIQUE: Multidetector CT imaging of the abdomen and pelvis was performed following the standard protocol without IV contrast.  COMPARISON:  CT 10/04/2011  FINDINGS: Lower chest: There is extensive emphysema at the lung bases. No change from prior. No pericardial fluid.  Hepatobiliary: No focal hepatic lesion on this non contrast exam. There are several low-density cysts within the liver not changed from prior. No ductal dilatation.  Pancreas: Pancreas is normal. No ductal dilatation. No pancreatic inflammation.  Spleen: Normal spleen.  Adrenals/urinary tract: Adrenal glands  are normal. No nephrolithiasis or ureterolithiasis. There bilateral simple fluid attenuation cysts which are not changed from prior.  There is suprapubic catheter in place. The bladder is collapsed. The bladder is anterior and superior to the pubic symphysis.  Stomach/Bowel: Stomach demonstrates a moderate size hiatal hernia. The duodenum and small bowel are normal. There is a rim of gas along the dependent luminal surface of the cecum and ascending colon. This gas is not clearly evident along the nondependent surface of felt to relate to stool rather than pneumatosis. There is a moderate volume stool in the right colon. Appendix is not identified. The transverse colon is ptotic and extends into the upper pelvis. Moderate volume of stool in the transverse colon. The sigmoid colon is likewise tortuous. No obstructing lesion identified. The rectum is collapsed.  Vascular/Lymphatic: Endovascular repair of the abdominal aortic aneurysm not changed from prior. No retroperitoneal periportal lymphadenopathy.  Reproductive: Prior prostatectomy. No free fluid small amount free fluid the pelvis.  Musculoskeletal: No aggressive osseous lesion.  Other: No abscess or inflammation  IMPRESSION: 1. Suprapubic catheter in place the collapsed bladder which is positioned superior to the pubic symphysis. No complicating features identified. 2. No hydronephrosis or ureterolithiasis. 3. Moderate volume stool in the right colon and transverse colon suggests constipation. 4. Small amount free fluid the pelvis. 5. Endovascular repair of aortic aneurysm without complicating features.   Electronically Signed   By: Suzy Bouchard M.D.   On: 05/04/2014 10:37   Dg Chest 2 View  04/07/2014   CLINICAL DATA:  Shortness of breath, COPD, hypertension.  EXAM: CHEST  2 VIEW  COMPARISON:  03/30/2014  FINDINGS: Severe COPD/emphysema. Scarring in the lung bases. Heart is normal size. Small hiatal  hernia. No confluent airspace opacities or effusions.  No acute bony abnormality.  IMPRESSION: Severe COPD.  No active disease.   Electronically Signed   By: Rolm Baptise M.D.   On: 04/07/2014 14:16   Dg Chest Port 1 View  05/04/2014   CLINICAL DATA:  Abdominal pain.  EXAM: PORTABLE CHEST - 1 VIEW  COMPARISON:  04/07/2014  FINDINGS: The patient has severe chronic interstitial and obstructive lung disease.  There is increased accentuation of the interstitial markings at both lung bases suggesting interstitial edema superimposed on chronic disease. No consolidative infiltrates or effusions. Chronic thoracolumbar scoliosis. No acute osseous abnormality.  IMPRESSION: Possible interstitial edema at the lung bases superimposed on severe emphysema and interstitial lung disease.   Electronically Signed   By: Rozetta Nunnery M.D.   On: 05/04/2014 10:05         Subjective:  patient complains of left upper abdominal pain and chest pain reproducible to touch. Denies fevers, chills, nausea, vomiting, diarrhea. No dysuria. No headaches or dizziness. No fevers or chills   Objective: Filed Vitals:   05/06/14 0700 05/06/14 0800 05/06/14 0900 05/06/14 1000  BP:    158/61  Pulse: 90 109 89 90  Temp:  97.8 F (36.6 C)    TempSrc:  Oral    Resp: 18 27 17 19   Height:      Weight:      SpO2: 96% 86% 91% 92%    Intake/Output Summary (Last 24 hours) at 05/06/14 1243 Last data filed at 05/06/14 0700  Gross per 24 hour  Intake   2100 ml  Output    466 ml  Net   1634 ml   Weight change: 1.2 kg (2 lb 10.3 oz) Exam:   General:  Pt is alert, follows commands appropriately, not in acute distress  HEENT: No icterus, No thrush,[place]/AT  Cardiovascular: RRR, S1/S2, no rubs, no gallops; left-sided chest tender to palpation    Respiratory: bibasilar rales. Diminished breath sounds bilateral. No wheezing.    Abdomen: Soft/+BS, non tender, non distended, no guarding  Extremities: No edema, No lymphangitis, No petechiae, No rashes, no synovitis  Data  Reviewed: Basic Metabolic Panel:  Recent Labs Lab 05/04/14 0905 05/04/14 0937 05/05/14 0327 05/06/14 0339  NA 140 138 138 141  K 4.7 4.5 4.9 4.5  CL 102 105 103 109  CO2 21  --  22 22  GLUCOSE 134* 129* 111* 106*  BUN 31* 32* 43* 43*  CREATININE 1.54* 1.70* 1.70* 1.49*  CALCIUM 9.5  --  8.3* 8.0*   Liver Function Tests:  Recent Labs Lab 05/04/14 0905 05/05/14 0327  AST 24 20  ALT 8 7  ALKPHOS 82 61  BILITOT 0.8 0.4  PROT 6.1 5.0*  ALBUMIN 3.1* 2.3*    Recent Labs Lab 05/04/14 0928  LIPASE 21   No results for input(s): AMMONIA in the last 168 hours. CBC:  Recent Labs Lab 05/04/14 0905 05/04/14 0937 05/05/14 0327 05/06/14 0339  WBC 15.4*  --  11.9* 8.8  NEUTROABS 14.1*  --   --   --   HGB 13.5 15.0 12.1* 10.7*  HCT 42.3 44.0 37.4* 34.1*  MCV 85.3  --  84.2 85.7  PLT 276  --  218 164   Cardiac Enzymes: No results for input(s): CKTOTAL, CKMB, CKMBINDEX, TROPONINI in the last 168 hours. BNP: Invalid input(s): POCBNP CBG: No results for input(s): GLUCAP in the last 168 hours.  Recent Results (from the past 240 hour(s))  Urine culture  Status: None   Collection Time: 05/04/14 10:45 AM  Result Value Ref Range Status   Specimen Description URINE, CATHETERIZED  Final   Special Requests NONE  Final   Culture  Setup Time   Final    05/04/2014 13:03 Performed at [place]   Final    >=100,000 COLONIES/ML Performed at [place] Performed at [facility]   Final   Report Status 05/06/2014 FINAL  Final     Scheduled Meds: . gabapentin  100 mg Oral TID  . mirabegron ER  50 mg Oral Daily  . mirtazapine  15 mg Oral QHS  . piperacillin-tazobactam (ZOSYN)  IV  3.375 g Intravenous Q8H  . polyethylene glycol  17 g Oral Daily  . vancomycin  500 mg Intravenous Q24H   Continuous Infusions: . sodium chloride 100 mL/hr at 05/05/14 1800     Miel Wisener, DO  [facility] Pager  [phone]  If 7PM-7AM, please contact night-coverage [email or web address] Password [email or web address] 05/06/2014, 12:43 PM   LOS: 2 days

## 2014-05-06 NOTE — Progress Notes (Signed)
Gave report to [employer] on 5E

## 2014-05-06 NOTE — Progress Notes (Signed)
PT Cancellation Note  Patient Details Name: Bill [name] MRN: [id number] DOB: 1931/01/01   Cancelled Treatment:    Reason Eval/Treat Not Completed: Patient at procedure or test/unavailable   [facility] 05/06/2014, 4:28 PM

## 2014-05-07 DIAGNOSIS — R0600 Dyspnea, unspecified: Secondary | ICD-10-CM

## 2014-05-07 DIAGNOSIS — R0689 Other abnormalities of breathing: Secondary | ICD-10-CM

## 2014-05-07 DIAGNOSIS — R630 Anorexia: Secondary | ICD-10-CM

## 2014-05-07 DIAGNOSIS — J189 Pneumonia, unspecified organism: Secondary | ICD-10-CM

## 2014-05-07 DIAGNOSIS — Z515 Encounter for palliative care: Secondary | ICD-10-CM

## 2014-05-07 LAB — CBC
HEMATOCRIT: 33.9 % — AB (ref 39.0–52.0)
Hemoglobin: 10.9 g/dL — ABNORMAL LOW (ref 13.0–17.0)
MCH: 27.6 pg (ref 26.0–34.0)
MCHC: 32.2 g/dL (ref 30.0–36.0)
MCV: 85.8 fL (ref 78.0–100.0)
Platelets: 164 10*3/uL (ref 150–400)
RBC: 3.95 MIL/uL — ABNORMAL LOW (ref 4.22–5.81)
RDW: 17.3 % — AB (ref 11.5–15.5)
WBC: 7.8 10*3/uL (ref 4.0–10.5)

## 2014-05-07 LAB — BASIC METABOLIC PANEL
Anion gap: 11 (ref 5–15)
BUN: 31 mg/dL — AB (ref 6–23)
CALCIUM: 8.2 mg/dL — AB (ref 8.4–10.5)
CHLORIDE: 109 meq/L (ref 96–112)
CO2: 20 mEq/L (ref 19–32)
CREATININE: 1.3 mg/dL (ref 0.50–1.35)
GFR calc Af Amer: 58 mL/min — ABNORMAL LOW (ref >90)
GFR calc non Af Amer: 50 mL/min — ABNORMAL LOW (ref >90)
Glucose, Bld: 93 mg/dL (ref 70–99)
Potassium: 3.5 mEq/L — ABNORMAL LOW (ref 3.7–5.3)
Sodium: 140 mEq/L (ref 137–147)

## 2014-05-07 LAB — VANCOMYCIN, TROUGH: Vancomycin Tr: <5 ug/mL — ABNORMAL LOW (ref 10.0–20.0)

## 2014-05-07 MED ORDER — SORBITOL 70 % SOLN
30.0000 mL | Freq: Every morning | Status: DC
  Administered 2014-05-08 – 2014-05-09 (×2): 30 mL via ORAL
  Filled 2014-05-07 (×2): qty 30

## 2014-05-07 MED ORDER — MORPHINE SULFATE 2 MG/ML IJ SOLN: 2.0000 mg | Freq: Four times a day (QID) | INTRAMUSCULAR | Status: DC | PRN

## 2014-05-07 MED ORDER — BISACODYL 10 MG RE SUPP: 10.0000 mg | Freq: Once | RECTAL | Status: DC

## 2014-05-07 MED ORDER — MORPHINE SULFATE (CONCENTRATE) 10 MG/0.5ML PO SOLN
5.0000 mg | ORAL | Status: DC | PRN
  Administered 2014-05-07 – 2014-05-09 (×4): 5 mg via ORAL
  Filled 2014-05-07 (×4): qty 0.5

## 2014-05-07 MED ORDER — ENSURE COMPLETE PO LIQD
237.0000 mL | Freq: Three times a day (TID) | ORAL | Status: DC
  Administered 2014-05-08 – 2014-05-09 (×4): 237 mL via ORAL

## 2014-05-07 MED ORDER — SODIUM CHLORIDE 0.9 % IV SOLN
500.0000 mg | Freq: Two times a day (BID) | INTRAVENOUS | Status: DC
  Administered 2014-05-08 – 2014-05-09 (×3): 500 mg via INTRAVENOUS
  Filled 2014-05-07 (×4): qty 500

## 2014-05-07 MED ORDER — DOCUSATE SODIUM 100 MG PO CAPS
100.0000 mg | ORAL_CAPSULE | Freq: Two times a day (BID) | ORAL | Status: DC
  Administered 2014-05-07 – 2014-05-09 (×5): 100 mg via ORAL
  Filled 2014-05-07 (×6): qty 1

## 2014-05-07 NOTE — Progress Notes (Signed)
Full note to follow:  I met today with Bill [name] (he is a little confused today - this is new as of yesterday per daughter), his daughter, and son. We discussed overall poor prognosis and his failure to thrive and the fact that he will not eat or drink. Family tells me that he had shingles and pneumonia the beginning of November and has never recovered. However, the decreased appetite and weight loss was going on prior to illness. He is mainly bed bound at this point and has a suprapubic catheter - discussed likelihood of frequent recurrent infection and that his body is not strong enough to ward off infection. Family seems to understand. We discussed options of SNF with palliative/hospice or hospice facility and their first choice would be [facility] as it is very close to daughter. They do seem open to hospice facility. We completed MOST: DNR, comfort measures with no rehospitalization, consider risk/benefits of antibiotics, no IV fluids, and no feeding tube. They do wish to continue antibiotics at this point. I will change pain/dyspnea medication to roxanol in preparation for discharge. I will also change Miralax to sorbitol as he is not able or willing to drink the Miralax per family. I will follow up tomorrow and continue to support.   Vinie Sill, NP Palliative Medicine Team Pager # [phone] (M-F 8a-5p) Team Phone # [phone] (Nights/Weekends)

## 2014-05-07 NOTE — Consult Note (Signed)
Patient Bill [name]      DOB: [birth year]      AVW:[id number]     Consult Note from the Palliative Medicine Team at [facility] Requested by: Dr. Carles Collet    PCP: Shanda Bumps, MD Reason for Consultation: GOC, help with disposition Phone Number:[phone]  Assessment of patients Current state: I met today with Mr. Bill [name] (he is a little confused today - this is new as of yesterday per daughter), his daughter, and son. We discussed overall poor prognosis and his failure to thrive and the fact that he will not eat or drink. Family tells me that he had shingles and pneumonia the beginning of November and has never recovered. However, the decreased appetite and weight loss was going on prior to illness. He is mainly bed bound at this point and has a suprapubic catheter - discussed likelihood of frequent recurrent infection and that his body is not strong enough to ward off infection. Family seems to understand. We discussed options of SNF with palliative/hospice or hospice facility and [facility] choice would be [facility] as it is very close to daughter. They do seem open to hospice facility. We completed MOST: DNR, comfort measures with no rehospitalization, consider risk/benefits of antibiotics, no IV fluids, and no feeding tube. They do wish to continue antibiotics at this point. I will change pain/dyspnea medication to roxanol in preparation for discharge. I will also change Miralax to sorbitol as he is not able or willing to drink the Miralax per family. I will follow up tomorrow and continue to support.    Goals of Care: 1.  Code Status: DNR   2. Scope of Treatment: Continue gentle supportive care such as IV fluids, antibiotics, labs. Minimize medications.    4. Disposition: To be further discussed. Family currently wanting SNF.    3. Symptom Management:   1. Pain/dyspnea: Roxanol 5 mg every hour prn. Morphine 2 mg IV every 6 hours if roxanol ineffective.   2. Constipation: Colace BID. Changed to sorbitol 70% 30 mL daily instead of miralax as he cannot drink the miralaz. Encourage dulcolax supp but he is refusing currently.  3. Severe protein calorie malnutrition: Continue remeron qhs. Ensure increased to TID.  4. Agitation/Sleep: Lorazepam 0.5 mg BID prn. May increase frequency as needed.    4. Psychosocial: Emotional support provided to patient and family at bedside.   5. Spiritual: Support from personal pastor.     Patient Documents Completed or Given: Document Given Completed  Advanced Directives Pkt    MOST  yes  DNR  yes  Gone from [facility]    Hard Choices      Brief HPI: 80 yo male admitted with vomiting r/t UTI (suprapubic catheter) and HCAP vs aspiration pneumonia. He is failure to thrive with severe generalized muscle wasting with weight loss and severely decreased intake over past ~ 2 months. He had recent pneumonia and shingles infection causing neuropathic pain and tenderness around left shoulder. Also treated for acute kidney injury. PMH significant for HTN, COPD, AAA, prostate cancer.    ROS: + decreased appetite, + pain around left shoulder. Denies sleep disturbance.     PMH:  Past Medical History  Diagnosis Date  . Hypertension   . COPD (chronic obstructive pulmonary disease)   . AAA (abdominal aortic aneurysm)   . Hyperlipidemia   . Prostate cancer      PSH: Past Surgical History  Procedure Laterality Date  . Transurethral resection  of prostate    . Endovascular stent insertion  03/27/08    Endovascular Repair of AAA w/ Gore Graft  . Stomach surgery  1971    peptic ulcer disease   I have reviewed the FH and SH and  If appropriate update it with new information. No Known Allergies Scheduled Meds: . bisacodyl  10 mg Rectal Once  . docusate sodium  100 mg Oral BID  . feeding supplement (ENSURE COMPLETE)  237 mL Oral TID BM  . gabapentin  100 mg Oral TID  . mirabegron ER  50 mg Oral Daily  .  mirtazapine  15 mg Oral QHS  . piperacillin-tazobactam (ZOSYN)  IV  3.375 g Intravenous Q8H  . [START ON 05/08/2014] sorbitol  30 mL Oral q morning - 10a  . [START ON 05/08/2014] vancomycin  500 mg Intravenous Q12H   Continuous Infusions: . sodium chloride 50 mL/hr at 05/07/14 1418   PRN Meds:.acetaminophen **OR** acetaminophen, bisacodyl, LORazepam, morphine injection, morphine CONCENTRATE, ondansetron **OR** ondansetron (ZOFRAN) IV    BP 143/96 mmHg  Pulse 98  Temp(Src) 98.4 F (36.9 C) (Oral)  Resp 18  Ht $R'5\' 11"'rT$  (1.803 m)  Wt 53.4 kg (117 lb 11.6 oz)  BMI 16.43 kg/m2  SpO2 90%   PPS: 20%   Intake/Output Summary (Last 24 hours) at 05/07/14 1711 Last data filed at 05/07/14 1418  Gross per 24 hour  Intake 2268.33 ml  Output    550 ml  Net 1718.33 ml   LBM: 05/07/14  Physical Exam:  General: NAD, thin, frail, tenderness over left clavicular/scapular are h/o recent shingles HEENT: Temporal muscle wasting, no JVD, moist mucous membranes Chest: Diminished throughout, rales in bases, labored with conversation at times CVS: RRR, S1 S2 Abdomen: Soft, NT, ND, +BS Ext: MAE, no edema, warm to touch Neuro: Awake, alert, confused in conversation   Labs: CBC    Component Value Date/Time   WBC 7.8 05/07/2014 0433   RBC 3.95* 05/07/2014 0433   HGB 10.9* 05/07/2014 0433   HCT 33.9* 05/07/2014 0433   PLT 164 05/07/2014 0433   MCV 85.8 05/07/2014 0433   MCH 27.6 05/07/2014 0433   MCHC 32.2 05/07/2014 0433   RDW 17.3* 05/07/2014 0433   LYMPHSABS 0.4* 05/04/2014 0905   MONOABS 0.9 05/04/2014 0905   EOSABS 0.0 05/04/2014 0905   BASOSABS 0.0 05/04/2014 0905    BMET    Component Value Date/Time   NA 140 05/07/2014 0433   K 3.5* 05/07/2014 0433   CL 109 05/07/2014 0433   CO2 20 05/07/2014 0433   GLUCOSE 93 05/07/2014 0433   BUN 31* 05/07/2014 0433   CREATININE 1.30 05/07/2014 0433   CALCIUM 8.2* 05/07/2014 0433   GFRNONAA 50* 05/07/2014 0433   GFRAA 58* 05/07/2014  0433    CMP     Component Value Date/Time   NA 140 05/07/2014 0433   K 3.5* 05/07/2014 0433   CL 109 05/07/2014 0433   CO2 20 05/07/2014 0433   GLUCOSE 93 05/07/2014 0433   BUN 31* 05/07/2014 0433   CREATININE 1.30 05/07/2014 0433   CALCIUM 8.2* 05/07/2014 0433   PROT 5.0* 05/05/2014 0327   ALBUMIN 2.3* 05/05/2014 0327   AST 20 05/05/2014 0327   ALT 7 05/05/2014 0327   ALKPHOS 61 05/05/2014 0327   BILITOT 0.4 05/05/2014 0327   GFRNONAA 50* 05/07/2014 0433   GFRAA 58* 05/07/2014 0433    Time In Time Out Total Time Spent with Patient Total Overall Time  1430  1600 22mn 982m    Greater than 50%  of this time was spent counseling and coordinating care related to the above assessment and plan.  AlVinie SillNP Palliative Medicine Team Pager # [phone]-F 8a-5p) Team Phone # [phone]/Weekends)

## 2014-05-07 NOTE — Progress Notes (Signed)
Clinical Social Work Department BRIEF PSYCHOSOCIAL ASSESSMENT 05/07/2014  Patient:  Bill [name], Bill [name]     Account Number:  [id number]     Admit date:  05/04/2014  Clinical Social Worker:  Earlie Server  Date/Time:  05/07/2014 01:20 PM  Referred by:  Care Management  Date Referred:  05/07/2014 Referred for  SNF Placement   Other Referral:   Interview type:  Family Other interview type:   Dtr-Carolyn    PSYCHOSOCIAL DATA Living Status:  ALONE Admitted from facility:   Level of care:   Primary support name:  Hoyle Sauer Primary support relationship to patient:  CHILD, ADULT Degree of support available:   Strong    CURRENT CONCERNS Current Concerns  Post-Acute Placement   Other Concerns:    SOCIAL WORK ASSESSMENT / PLAN CSW received referral to assist with DC planning. CSW met with patient and dtr and explained role. Patient trying to rest so dtr ask that we speak in the hallway.    Patient was living at home prior to admission. Patient's wife passed away about 9 years ago and he has one son and dtr that are involved in care. Dtr reports she has been speaking with doctors but wants her brother to come to be involved with care as well. Dtr is interested in patient returning to [facility] with palliative vs hospice home. Dtr aware that PMT will meet with her to discuss if patient is eligible for hospice. Dtr agreeable for CSW to send information to [facility] and to follow up after [place] meeting.    CSW completed FL2 and faxed out. CSW provided dtr with SNF list and explained Medicare coverage for SNF. Dtr thanked CSW for information and reports understanding. CSW will continue to follow.   Assessment/plan status:  Psychosocial Support/Ongoing Assessment of Needs Other assessment/ plan:   Information/referral to community resources:   SNF vs Hospice care    PATIENT'S/FAMILY'S RESPONSE TO PLAN OF CARE: Patient alert but does not want to engage in assessment at this time.  Dtr thanked CSW for visit and reports she is feeling overwhelmed and does not want to make any final decisions without brother's approval. Dtr reports patient enjoyed SNF in the past but wants to talk with PMT to determine if this is a good plan as well. Dtr thanked CSW for time and reports she will call with any further questions.       [place], [place] [phone]

## 2014-05-07 NOTE — Evaluation (Signed)
Physical Therapy Evaluation Patient Details Name: Bill [name] MRN: [id number] DOB: 10-10-1930 Today's Date: 05/07/2014   History of Present Illness  80 yo male adm with sepsis d/t HCAP and UTI; PMHx: HTN, COPD  Clinical Impression  Pt very limited this date due to severe (4/4) DOE; only able to sit EOB and pt requesting to lie down the entire time he was sitting;  Palliative Consult pending, will follow for [place]; Pt would be unable to manage at home at his current status, would need 24hr assist or SNF;     Follow Up Recommendations SNF [facility] pending)    Equipment Recommendations  Other (comment) (TBD)    Recommendations for Other Services       Precautions / Restrictions Precautions Precautions: Fall Restrictions Weight Bearing Restrictions: No      Mobility  Bed Mobility Overal bed mobility: Needs Assistance Bed Mobility: Supine to Sit     Supine to sit: Mod assist;Max assist     General bed mobility comments: cues for self assist and technique; pt requires incr time and fatigues rapidly  Transfers                    Ambulation/Gait                Stairs            Wheelchair Mobility    Modified Rankin (Stroke Patients Only)       Balance Overall balance assessment: Needs assistance Sitting-balance support: Bilateral upper extremity supported Sitting balance-Leahy Scale: Poor   Postural control: Posterior lean                                   Pertinent Vitals/Pain Pain Assessment: 0-10 Pain Score: 6  Pain Location: abd Pain Intervention(s): Premedicated before session;Limited activity within patient's tolerance    Home Living Family/patient expects to be discharged to:: Private residence Living Arrangements: Alone   Type of Home: House Home Access: Stairs to enter   Technical brewer of Steps: 2 Home Layout: One level Home Equipment: Environmental consultant - 2 wheels;Cane - single point Additional Comments: some  other DME in attic d/t pt wife had ALS _ dtr states "we ahve everything"    Prior Function Level of Independence: Independent         Comments: HH aide 5 days per week, 3hrs per day     Hand Dominance        Extremity/Trunk Assessment   Upper Extremity Assessment: Generalized weakness           Lower Extremity Assessment: Generalized weakness         Communication   Communication: No difficulties  Cognition Arousal/Alertness: Awake/alert Behavior During Therapy: WFL for tasks assessed/performed Overall Cognitive Status: Within Functional Limits for tasks assessed                      General Comments General comments (skin integrity, edema, etc.): pt with severe DOE    Exercises        Assessment/Plan    PT Assessment Patient needs continued PT services  PT Diagnosis Difficulty walking   PT Problem List Decreased strength;Decreased activity tolerance;Decreased balance;Decreased mobility  PT Treatment Interventions DME instruction;Functional mobility training;Gait training;Therapeutic activities;Therapeutic exercise;Patient/family education   PT Goals (Current goals can be found in the Care Plan section) Acute Rehab PT Goals Patient Stated Goal: pt adn dtr unsure  at this time PT Goal Formulation: With patient Time For Goal Achievement: 05/14/14 Potential to Achieve Goals: Good    Frequency Min 2X/week   Barriers to discharge        Co-evaluation               End of Session   Activity Tolerance: Patient tolerated treatment well Patient left: in bed;with call bell/phone within reach;with family/visitor present Nurse Communication: Mobility status         Time: 5361-4431 PT Time Calculation (min) (ACUTE ONLY): 26 min   Charges:   PT Evaluation $Initial PT Evaluation Tier I: 1 Procedure PT Treatments $Therapeutic Activity: 23-37 mins   PT G Codes:          Mekiah Wahler 05/31/14, 1:29 PM

## 2014-05-07 NOTE — Progress Notes (Signed)
Mr. Bill [name] expressed to his daughter that he is ready to go "home". She stated that he means, leaving this earth, and passing on.   She is willing to honor his wishes but wants to know what the plan is. She states that her brother is coming at 1pm, and she wants to have a discussion with the MD, and all family members to establish a plan.   MD paged at 45, about daughters concerns.

## 2014-05-07 NOTE — Progress Notes (Addendum)
Clinical Social Work Department CLINICAL SOCIAL WORK PLACEMENT NOTE 05/07/2014  Patient:  Bill [name], Bill [name]  Account Number:  [id number] Admit date:  05/04/2014  Clinical Social Worker:  Sindy Messing, LCSW  Date/time:  05/07/2014 01:00 PM  Clinical Social Work is seeking post-discharge placement for this patient at the following level of care:   [place]   (*CSW will update this form in Epic as items are completed)   05/07/2014  Patient/family provided with [place] Department of Clinical Social Work's list of facilities offering this level of care within the geographic area requested by the patient (or if unable, by the patient's family).  05/07/2014  Patient/family informed of their freedom to choose among providers that offer the needed level of care, that participate in Medicare, Medicaid or managed care program needed by the patient, have an available bed and are willing to accept the patient.  05/07/2014  Patient/family informed of [facility]' ownership interest in [facility], as well as of the fact that they are under no obligation to receive care at this facility.  PASARR submitted to EDS on existing # PASARR number received on   FL2 transmitted to all facilities in geographic area requested by pt/family on  05/07/2014 FL2 transmitted to all facilities within larger geographic area on   Patient informed that his/her managed care company has contracts with or will negotiate with  certain facilities, including the following:     Patient/family informed of bed offers received:   Patient chooses bed at  Physician recommends and patient chooses bed at    Patient to be transferred to  on   Patient to be transferred to facility by  Patient and family notified of transfer on  Name of family member notified:    The following physician request were entered in Epic:   Additional Comments: 05/08/14- Patient to DC home with hospice care.

## 2014-05-07 NOTE — Progress Notes (Signed)
ANTIBIOTIC CONSULT NOTE   Pharmacy Consult for vancomycin, Zosyn Indication: rule out sepsis  No Known Allergies  Patient Measurements: Height: 5\' 11"  (180.3 cm) Weight: 117 lb 11.6 oz (53.4 kg) IBW/kg (Calculated) : 75.3  Vital Signs: Temp: 98.4 F (36.9 C) (12/10 1400) Temp Source: Oral (12/10 1400) BP: 143/96 mmHg (12/10 1400) Pulse Rate: 98 (12/10 1400) Intake/Output from previous day: 12/09 0701 - 12/10 0700 In: 2400 [I.V.:2400] Out: 750 [Urine:750] Intake/Output from this shift: Total I/O In: 780 [I.V.:730; IV Piggyback:50] Out: -   Labs:  Recent Labs  05/05/14 0327 05/06/14 0339 05/07/14 0433  WBC 11.9* 8.8 7.8  HGB 12.1* 10.7* 10.9*  PLT 218 164 164  CREATININE 1.70* 1.49* 1.30   Estimated Creatinine Clearance: 33.7 mL/min (by C-G formula based on Cr of 1.3).  Recent Labs  05/07/14 1355  [place] <5.0*     Microbiology: Recent Results (from the past 720 hour(s))  Urine culture     Status: None   Collection Time: 05/04/14 10:45 AM  Result Value Ref Range Status   Specimen Description URINE, CATHETERIZED  Final   Special Requests NONE  Final   Culture  Setup Time   Final    05/04/2014 13:03 Performed at [place] [facility]   Final    >=100,000 COLONIES/ML Performed at [place] Performed at [facility]   Final   Report Status 05/06/2014 FINAL  Final  MRSA PCR Screening     Status: None   Collection Time: 05/06/14  7:52 PM  Result Value Ref Range Status   MRSA by PCR NEGATIVE NEGATIVE Final    Comment:        The GeneXpert MRSA Assay (FDA approved for NASAL specimens only), is one component of a comprehensive MRSA colonization surveillance program. It is not intended to diagnose MRSA infection nor to guide or monitor treatment for MRSA infections.     Assessment: 80 yo male presented to ER with CC hematemesis and diffuse abdominal pain since yesterday. Patient s/p urostomy  placement also with bloody output since yesterday. PMH HTN, COPD, AAA, HLP, prostate CA s/p TURP. To start vancomycin and Zosyn per pharmacy dosing for sepsis secondary to UTI and HCAP.  12/7 >> Vancomycin >> 12/7 >> Zosyn >>   Tmax: AFeb WBCs: decreased to WNL Renal: SCr decreased to 1.3, CrCl ~ 34 CG  12/7 urine: >= 100,000 colonies/mL yeast (per MD, likely colonizer, suprapubic catheter changed 12/8) 12/9 MRSA PCR: negative  Drug level / dose changes info: 12/10 1340 VT: <73mcg/ml on vancomycin 500mg  IV q24h (prior to 4th dose)  Goal of Therapy:  Vancomycin trough level 15-20 mcg/ml  Plan:  1) Vancomycin trough undetectable this afternoon, change vancomycin to 500mg  IV q12h for est peak = 29 and trough = 17.6 mcg/ml  - check vancomycin trough on new regimen if remains on for > additional 72h 2) Zosyn 3.375g IV q8 (extended interval infusion) for CrCl > 20 ml/min  Doreene Eland, PharmD, BCPS.   Pager: [phone] 05/07/2014 3:56 PM

## 2014-05-07 NOTE — Progress Notes (Signed)
PROGRESS NOTE  Bill [name] MAU:[id number] DOB: 1930-10-01 DOA: 05/04/2014 PCP: Shanda Bumps, MD  Assessment/Plan: Sepsis -Present at the time of admission -Secondary to HCAP and UTI -Continue empiric vancomycin and Zosyn UTI  -Patient has chronic indwelling suprapubic catheter  -Suprapubic catheter changed 05/05/2014  -Continue empiric antibiotics pending culture data  -urine culture= yeast--likely a colonizer, sample obtained prior to suprapubic catheter change -will not treat yeast as pt is clinically improving/stable without antifungal HCAP/Aspiration pneumonitis -Chest x-ray reveals bibasilar infiltrates which are new when compared to a chest x-ray on 04/07/2014  -Continue empiric antibiotics pending culture data  -Patient was recently discharged from [facility] with PNA -WBC improved Abdominal pain  -Suspect may be due to constipation as CT revealed moderate stool in the colon  -Cathartics  -2 view abdomen x-ray--neg for free air or obstruction AKI -secondary to sepsis/ATN and volume depletion in setting of vomiting -improving slowly--serum creatinine appears back to baseline today -judicious IVF -d/c lisinopril Goals of Care -discussed concepts of hospice and comfort care with pt and daughter at bedside -pt still has some concerns regarding disposition and comfort care -I have consulted palliative medicine to continue the discussion of comfort care and assist with disposition--pt and daughter are agreeable to speak to them postherpetic neuralgia  -Continue Norco and gabapentin  Severe protein calorie malnutrition  -Ensure complete    Family Communication:   Daughter updated at beside--total time 35 min, >50% spent counseling and coordinating care Disposition Plan:   Home when medically stable       Procedures/Studies: Ct Abdomen Pelvis Wo Contrast  05/04/2014   CLINICAL DATA:  Abdominal pain. Bloody output from  suprapubic catheter.  EXAM: CT ABDOMEN AND PELVIS WITHOUT CONTRAST  TECHNIQUE: Multidetector CT imaging of the abdomen and pelvis was performed following the standard protocol without IV contrast.  COMPARISON:  CT 10/04/2011  FINDINGS: Lower chest: There is extensive emphysema at the lung bases. No change from prior. No pericardial fluid.  Hepatobiliary: No focal hepatic lesion on this non contrast exam. There are several low-density cysts within the liver not changed from prior. No ductal dilatation.  Pancreas: Pancreas is normal. No ductal dilatation. No pancreatic inflammation.  Spleen: Normal spleen.  Adrenals/urinary tract: Adrenal glands are normal. No nephrolithiasis or ureterolithiasis. There bilateral simple fluid attenuation cysts which are not changed from prior.  There is suprapubic catheter in place. The bladder is collapsed. The bladder is anterior and superior to the pubic symphysis.  Stomach/Bowel: Stomach demonstrates a moderate size hiatal hernia. The duodenum and small bowel are normal. There is a rim of gas along the dependent luminal surface of the cecum and ascending colon. This gas is not clearly evident along the nondependent surface of felt to relate to stool rather than pneumatosis. There is a moderate volume stool in the right colon. Appendix is not identified. The transverse colon is ptotic and extends into the upper pelvis. Moderate volume of stool in the transverse colon. The sigmoid colon is likewise tortuous. No obstructing lesion identified. The rectum is collapsed.  Vascular/Lymphatic: Endovascular repair of the abdominal aortic aneurysm not changed from prior. No retroperitoneal periportal lymphadenopathy.  Reproductive: Prior prostatectomy. No free fluid small amount free fluid the pelvis.  Musculoskeletal: No aggressive osseous lesion.  Other: No abscess or inflammation  IMPRESSION: 1. Suprapubic catheter in place the collapsed bladder which is positioned superior to the pubic  symphysis. No complicating features identified. 2. No  hydronephrosis or ureterolithiasis. 3. Moderate volume stool in the right colon and transverse colon suggests constipation. 4. Small amount free fluid the pelvis. 5. Endovascular repair of aortic aneurysm without complicating features.   Electronically Signed   By: Suzy Bouchard M.D.   On: 05/04/2014 10:37   Dg Chest 2 View  04/07/2014   CLINICAL DATA:  Shortness of breath, COPD, hypertension.  EXAM: CHEST  2 VIEW  COMPARISON:  03/30/2014  FINDINGS: Severe COPD/emphysema. Scarring in the lung bases. Heart is normal size. Small hiatal hernia. No confluent airspace opacities or effusions. No acute bony abnormality.  IMPRESSION: Severe COPD.  No active disease.   Electronically Signed   By: Rolm Baptise M.D.   On: 04/07/2014 14:16   Dg Chest Port 1 View  05/04/2014   CLINICAL DATA:  Abdominal pain.  EXAM: PORTABLE CHEST - 1 VIEW  COMPARISON:  04/07/2014  FINDINGS: The patient has severe chronic interstitial and obstructive lung disease.  There is increased accentuation of the interstitial markings at both lung bases suggesting interstitial edema superimposed on chronic disease. No consolidative infiltrates or effusions. Chronic thoracolumbar scoliosis. No acute osseous abnormality.  IMPRESSION: Possible interstitial edema at the lung bases superimposed on severe emphysema and interstitial lung disease.   Electronically Signed   By: Rozetta Nunnery M.D.   On: 05/04/2014 10:05   Dg Abd 2 Views  05/06/2014   CLINICAL DATA:  Low abdomen pain  EXAM: ABDOMEN - 2 VIEW  COMPARISON:  CT abdomen pelvis May 04, 2014  FINDINGS: There is mild prominence of colonic caliber unchanged compared to prior CT. There is no evidence of small bowel obstruction. Aortic stent is noted. Surgical clips are identified in the pelvis. Scoliosis of the spine is noted. There are changes of COPD in the lung bases.  IMPRESSION: No bowel obstruction or free air.   Electronically Signed    By: Abelardo Diesel M.D.   On: 05/06/2014 15:33         Subjective: Patient is feeling better. He continues to have some shortness of breath and generalized weakness. Denies fevers, chills, chest pain, nausea, vomiting, diarrhea. He still has some abdominal discomfort. No bowel movements in 2 days.  Objective: Filed Vitals:   05/06/14 1457 05/06/14 2110 05/07/14 0459 05/07/14 0700  BP: 159/72 145/67 138/71 137/67  Pulse: 98 94 99 100  Temp: 98.2 F (36.8 C) 98.9 F (37.2 C) 98.5 F (36.9 C) 98.4 F (36.9 C)  TempSrc: Oral Oral Oral Oral  Resp: 22 20 20 20   Height:      Weight:      SpO2: 98% 92% 92% 92%    Intake/Output Summary (Last 24 hours) at 05/07/14 1300 Last data filed at 05/07/14 0700  Gross per 24 hour  Intake   2400 ml  Output    750 ml  Net   1650 ml   Weight change:  Exam:   General:  Pt is alert, follows commands appropriately, not in acute distress  HEENT: No icterus, No thrush,  [place]/AT  Cardiovascular: RRR, S1/S2, no rubs, no gallops  Respiratory: Bibasilar rales. Diminished breath sound bilateral. No wheezing  Abdomen: Soft/+ periumbilical tenderness without any peritoneal signs non tender, non distended, no guarding  Extremities: No edema, No lymphangitis, No petechiae, No rashes, no synovitis  Data Reviewed: Basic Metabolic Panel:  Recent Labs Lab 05/04/14 0905 05/04/14 0937 05/05/14 0327 05/06/14 0339 05/07/14 0433  NA 140 138 138 141 140  K 4.7 4.5 4.9 4.5 3.5*  CL 102 105 103 109 109  CO2 21  --  22 22 20   GLUCOSE 134* 129* 111* 106* 93  BUN 31* 32* 43* 43* 31*  CREATININE 1.54* 1.70* 1.70* 1.49* 1.30  CALCIUM 9.5  --  8.3* 8.0* 8.2*   Liver Function Tests:  Recent Labs Lab 05/04/14 0905 05/05/14 0327  AST 24 20  ALT 8 7  ALKPHOS 82 61  BILITOT 0.8 0.4  PROT 6.1 5.0*  ALBUMIN 3.1* 2.3*    Recent Labs Lab 05/04/14 0928  LIPASE 21   No results for input(s): AMMONIA in the last 168 hours. CBC:  Recent  Labs Lab 05/04/14 0905 05/04/14 0937 05/05/14 0327 05/06/14 0339 05/07/14 0433  WBC 15.4*  --  11.9* 8.8 7.8  NEUTROABS 14.1*  --   --   --   --   HGB 13.5 15.0 12.1* 10.7* 10.9*  HCT 42.3 44.0 37.4* 34.1* 33.9*  MCV 85.3  --  84.2 85.7 85.8  PLT 276  --  218 164 164   Cardiac Enzymes: No results for input(s): CKTOTAL, CKMB, CKMBINDEX, TROPONINI in the last 168 hours. BNP: Invalid input(s): POCBNP CBG: No results for input(s): GLUCAP in the last 168 hours.  Recent Results (from the past 240 hour(s))  Urine culture     Status: None   Collection Time: 05/04/14 10:45 AM  Result Value Ref Range Status   Specimen Description URINE, CATHETERIZED  Final   Special Requests NONE  Final   Culture  Setup Time   Final    05/04/2014 13:03 Performed at [facility]   Final    >=100,000 COLONIES/ML Performed at [facility] Performed at [facility]   Final   Report Status 05/06/2014 FINAL  Final  MRSA PCR Screening     Status: None   Collection Time: 05/06/14  7:52 PM  Result Value Ref Range Status   MRSA by PCR NEGATIVE NEGATIVE Final    Comment:        The GeneXpert MRSA Assay (FDA approved for NASAL specimens only), is one component of a comprehensive MRSA colonization surveillance program. It is not intended to diagnose MRSA infection nor to guide or monitor treatment for MRSA infections.      Scheduled Meds: . feeding supplement (ENSURE COMPLETE)  237 mL Oral BID BM  . gabapentin  100 mg Oral TID  . mirabegron ER  50 mg Oral Daily  . mirtazapine  15 mg Oral QHS  . piperacillin-tazobactam (ZOSYN)  IV  3.375 g Intravenous Q8H  . polyethylene glycol  17 g Oral Daily  . vancomycin  500 mg Intravenous Q24H   Continuous Infusions: . sodium chloride 100 mL/hr at 05/07/14 1129     Lilibeth Opie, DO  [facility] Pager [phone]  If 7PM-7AM, please contact night-coverage [email or web address] Password  [email or web address] 05/07/2014, 1:00 PM   LOS: 3 days

## 2014-05-07 NOTE — Progress Notes (Signed)
CARE MANAGEMENT NOTE 05/07/2014  Patient:  Bill [name], Bill [name]   Account Number:  [id number]  Date Initiated:  05/05/2014  Documentation initiated by:  DAVIS,RHONDA  Subjective/Objective Assessment:   pt with poss sepsis and hypotension     Action/Plan:   home when stable   Anticipated DC Date:  05/08/2014   Anticipated DC Plan:  HOME/SELF CARE  In-house referral  Clinical Social Worker      DC Planning Services  CM consult      [facility] Choice  NA   Choice offered to / List presented to:  NA   DME arranged  NA      DME agency  NA     [place] arranged  NA      [place] agency  NA   Status of service:  In process, will continue to follow Medicare Important Message given?   (If response is "NO", the following Medicare IM given date fields will be blank) Date Medicare IM given:   Medicare IM given by:   Date Additional Medicare IM given:   Additional Medicare IM given by:    Discharge Disposition:    Per UR Regulation:  Reviewed for med. necessity/level of care/duration of stay  If discussed at [place] of Stay Meetings, dates discussed:    Comments:  05/07/14 Edwyna Shell RN BSN CM [phone] ICU to [place] transfer 05/06/14, spoke with patient and daughter, Hoyle Sauer, at bedside and they stated that the patient has a [facility] aide that is private pay 5 days/week for 3 hours each day, has walker and wc. They stated that they have requested a Palliative Care consult and the daughter was requesting fromation regarding [facility] [place], informed that SW assists with facility placement and communication and referral would be made to SW. Will continue to follow  [id number]/Rhonda Davis,RN,BSN,CCM:

## 2014-05-08 LAB — BASIC METABOLIC PANEL
Anion gap: 10 (ref 5–15)
BUN: 25 mg/dL — AB (ref 6–23)
CO2: 22 meq/L (ref 19–32)
CREATININE: 1.15 mg/dL (ref 0.50–1.35)
Calcium: 8.3 mg/dL — ABNORMAL LOW (ref 8.4–10.5)
Chloride: 110 mEq/L (ref 96–112)
GFR calc Af Amer: 67 mL/min — ABNORMAL LOW (ref >90)
GFR calc non Af Amer: 58 mL/min — ABNORMAL LOW (ref >90)
Glucose, Bld: 90 mg/dL (ref 70–99)
Potassium: 3.4 mEq/L — ABNORMAL LOW (ref 3.7–5.3)
Sodium: 142 mEq/L (ref 137–147)

## 2014-05-08 MED ORDER — MORPHINE SULFATE (CONCENTRATE) 10 MG/0.5ML PO SOLN: 5.0000 mg | ORAL | Status: AC | PRN

## 2014-05-08 MED ORDER — LORAZEPAM 1 MG PO TABS: 0.5000 mg | ORAL_TABLET | Freq: Four times a day (QID) | ORAL | Status: AC | PRN

## 2014-05-08 MED ORDER — ENSURE COMPLETE PO LIQD: 237.0000 mL | Freq: Three times a day (TID) | ORAL | Status: AC

## 2014-05-08 MED ORDER — POTASSIUM CHLORIDE 10 MEQ/100ML IV SOLN
10.0000 meq | INTRAVENOUS | Status: AC
Start: 2014-05-08 — End: 2014-05-08
  Administered 2014-05-08 (×2): 10 meq via INTRAVENOUS
  Filled 2014-05-08 (×2): qty 100

## 2014-05-08 MED ORDER — SORBITOL 70 % SOLN: 30.0000 mL | Freq: Every morning | Status: AC

## 2014-05-08 MED ORDER — DSS 100 MG PO CAPS: 100.0000 mg | ORAL_CAPSULE | Freq: Two times a day (BID) | ORAL | Status: AC

## 2014-05-08 NOTE — Progress Notes (Signed)
Clinical Social Work  CSW spoke with CM and PMT NP who report that patient has decided to go home with hospice care. CSW alerted SNF of no need for services. CSW is signing off but available if needed.  [place], [place] [phone]

## 2014-05-08 NOTE — Progress Notes (Signed)
CARE MANAGEMENT NOTE 05/08/2014  Patient:  Bill [name], Bill [name]   Account Number:  [id number]  Date Initiated:  05/05/2014  Documentation initiated by:  DAVIS,RHONDA  Subjective/Objective Assessment:   pt with poss sepsis and hypotension     Action/Plan:   home when stable   Anticipated DC Date:  05/08/2014   Anticipated DC Plan:  HOME W HOSPICE CARE  In-house referral  Clinical Social Worker      DC Planning Services  CM consult      [facility] Choice  NA   Choice offered to / List presented to:  C-4 Adult Children   DME arranged  NA      DME agency  NA     [place] arranged  NA      [place] agency  [facility]   Status of service:  In process, will continue to follow Medicare Important Message given?  YES (If response is "NO", the following Medicare IM given date fields will be blank) Date Medicare IM given:  05/08/2014 Medicare IM given by:  Edwyna Shell Date Additional Medicare IM given:   Additional Medicare IM given by:    Discharge Disposition:  [place]  Per UR Regulation:  Reviewed for med. necessity/level of care/duration of stay  If discussed at [place] of Stay Meetings, dates discussed:    Comments:  05/08/14 Edwyna Shell RN BSN CM [phone] Spoke with patient and daughter Bill [name] and they stated that after consulting with [place] they have decided to go home with hospice. Choice offered and daughter stated they want to use [facility]. She stated that they have a private duty agency, [employer]ise[employer] Squ[employer], that will come in throughout the week and the patient's son will move in with the patient temporarily for support. This CM spoke with Juliann Pulse at [facility] and provided the referral information and faxed requested forms, and provided Carolyn's contact information. Juliann Pulse stated that she will follow up with Bill [name] to find out what DME is needed in the home and have that delivered. This CM spoke with Dr. Carles Collet and the patient  is tentatively scheduled for dc 12/12, therefore Juliann Pulse will have DME in home prior to discharge. Will need discharge summary faxed to [facility] at [phone] when prepared.  05/07/14 Edwyna Shell RN BSN CM [phone] ICU to [place] transfer 05/06/14, spoke with patient and daughter, Bill [name], at bedside and they stated that the patient has a [facility] aide that is private pay 5 days/week for 3 hours each day. They stated that they have requested a Palliative Care consult and the daughter was requesting fromation regarding [facility] in [place], informed that SW assists with facility placement and communication and referral would be made to SW. Will continue to follow  [id number]/Rhonda Davis,RN,BSN,CCM:

## 2014-05-08 NOTE — Discharge Summary (Addendum)
Physician Discharge Summary  Bill [name] JSH:[id number] DOB: 1930/09/18 DOA: 05/04/2014  PCP: Maryella Shivers, MD  Admit date: 05/04/2014 Discharge date: 05/09/14 Recommendations for Outpatient Follow-up:  1.  Patient is going home with hospice care with intention of not returning back to hospital  Discharge Diagnoses:  Sepsis -Present at the time of admission -Secondary to HCAP and UTI -Continue empiric vancomycin and Zosyn--patient received approx 6 days during the hospitalization -given his renal function, he received a dose of levofloxacin 750 mg on day of d/c which will complete 7 days of therapy UTI  -Patient has chronic indwelling suprapubic catheter=etiology of the UTI -Suprapubic catheter changed 05/05/2014  -Continue empiric antibiotics pending culture data  -urine culture= yeast--likely a colonizer, sample obtained prior to suprapubic catheter change -will not treat yeast as pt is clinically improving/stable without antifungal HCAP/Aspiration pneumonitis -Chest x-ray reveals bibasilar infiltrates which are new when compared to a chest x-ray on 04/07/2014  -Continue empiric antibiotics pending culture data  -Patient was recently discharged from [facility] with PNA -WBC improved -Family wishes to continue antibiotics- -given his renal function, he received a dose of levofloxacin 750 mg on day of d/c which will complete 7 days of therapy Abdominal pain  -Suspect may be due to constipation as CT revealed moderate stool in the colon  -Cathartics --pt had BM 05/07/14 -2 view abdomen x-ray--neg for free air or obstruction -Patient will go home with Roxanol every hour when necessary pain -Patient will go home with Ativan 0.5 mg every 6 hours when necessary anxiety -Patient will go home with Colace and sorbitol AKI -secondary to sepsis/ATN and volume depletion in setting of vomiting -improving slowly--serum creatinine appears back to baseline  today -judicious IVF -d/c lisinopril Goals of Care -discussed concepts of hospice and comfort care with pt and daughter at bedside -pt still has some concerns regarding disposition and comfort care -appreciate palliative medicine consult -plan is to go home with hospice care with focus on comfort--they also have private health agency to also come to help pt postherpetic neuralgia  -Continue Norco and gabapentin  -home with roxanol and ativan Severe protein calorie malnutrition  -Ensure complete   Discharge Condition: stable  Disposition: home with hospice  Diet:regular Wt Readings from Last 3 Encounters:  05/06/14 53.4 kg (117 lb 11.6 oz)    History of present illness:  80 year old male who  has a past medical history of Hypertension; COPD (chronic obstructive pulmonary disease); AAA (abdominal aortic aneurysm); Hyperlipidemia; and Prostate cancer. The patient has a history of urostomy and has a indwelling suprapubic catheter. The patient presented to the hospital with emesis, and he was found to be hypertensive with a septic-like picture. Workup revealed pneumonia and possible urinary tract infection. The patient was started on intravenous antibiotics with good clinical response. The patient was initially admitted to the stepdown unit and started on intravenous fluids. He was also found to be in acute on chronic renal failure. He was fluid resuscitated. His renal function returned to baseline. The patient was transferred to the medical floor. He remained afebrile and clinically stable. At the request of the patient and family palatal medicine was consulted. After discussion with palatal medicine, they agreed to have the patient go home with hospice care with change of the focus of care to comfort driven. Many of his non-syndrome medications were discontinued. The patient finished approximately 6 days of antibiotics in the hospital. He received one final dose of levofloxacin on day of  discharge. Given his renal function,  this will complete 7 days of therapy.   Consultants: Palliative medicine  Discharge Exam: Filed Vitals:   05/09/14 0515  BP: 159/79  Pulse: 88  Temp: 97.6 F (36.4 C)  Resp: 18   Filed Vitals:   05/08/14 1300 05/08/14 2113 05/09/14 0449 05/09/14 0515  BP: 143/83 161/84  159/79  Pulse: 95 75 107 88  Temp: 97.9 F (36.6 C) 97.8 F (36.6 C)  97.6 F (36.4 C)  TempSrc: Oral Oral  Oral  Resp: 19 18  18   Height:      Weight:      SpO2: 97% 92% 96% 97%   General: A&O x 3, NAD, pleasant, cooperative Cardiovascular: RRR, no rub, no gallop, no S3 Respiratory: Bibasilar rales. No wheezing. Good air movement.  Abdomen:soft, nontender, nondistended, positive bowel sounds Extremities: No edema, No lymphangitis, no petechiae  Discharge Instructions     Medication List    STOP taking these medications        aspirin 81 MG tablet     doxazosin 8 MG tablet  Commonly known as:  CARDURA     FLOMAX 0.4 MG Caps capsule  Generic drug:  tamsulosin     FLORA-Q Caps capsule     HYDROcodone-acetaminophen 5-325 MG per tablet  Commonly known as:  NORCO/VICODIN     lisinopril 20 MG tablet  Commonly known as:  PRINIVIL,ZESTRIL     propranolol 20 MG tablet  Commonly known as:  INDERAL     verapamil 180 MG (CO) 24 hr tablet  Commonly known as:  COVERA HS      TAKE these medications        DSS 100 MG Caps  Take 100 mg by mouth 2 (two) times daily.     feeding supplement (ENSURE COMPLETE) Liqd  Take 237 mLs by mouth 3 (three) times daily between meals.     Fluticasone-Salmeterol 250-50 MCG/DOSE Aepb  Commonly known as:  ADVAIR  Inhale 1 puff into the lungs 2 (two) times daily as needed (for shortness of breath and wheezing).     gabapentin 100 MG capsule  Commonly known as:  NEURONTIN  Take 1 capsule (100 mg total) by mouth 3 (three) times daily.     LORazepam 1 MG tablet  Commonly known as:  ATIVAN  Take 0.5 tablets (0.5 mg total)  by mouth every 6 (six) hours as needed for anxiety or sleep.     mirtazapine 15 MG tablet  Commonly known as:  REMERON  Take 15 mg by mouth at bedtime.     morphine CONCENTRATE 10 MG/0.5ML Soln concentrated solution  Take 0.25 mLs (5 mg total) by mouth every hour as needed for moderate pain or shortness of breath.     MYRBETRIQ 50 MG Tb24 tablet  Generic drug:  mirabegron ER  Take 50 mg by mouth daily.     omeprazole 20 MG capsule  Commonly known as:  PRILOSEC  Take 20 mg by mouth daily.     sorbitol 70 % Soln  Take 30 mLs by mouth every morning.         The results of significant diagnostics from this hospitalization (including imaging, microbiology, ancillary and laboratory) are listed below for reference.    Significant Diagnostic Studies: Ct Abdomen Pelvis Wo Contrast  05/04/2014   CLINICAL DATA:  Abdominal pain. Bloody output from suprapubic catheter.  EXAM: CT ABDOMEN AND PELVIS WITHOUT CONTRAST  TECHNIQUE: Multidetector CT imaging of the abdomen and pelvis was performed following the standard  protocol without IV contrast.  COMPARISON:  CT 10/04/2011  FINDINGS: Lower chest: There is extensive emphysema at the lung bases. No change from prior. No pericardial fluid.  Hepatobiliary: No focal hepatic lesion on this non contrast exam. There are several low-density cysts within the liver not changed from prior. No ductal dilatation.  Pancreas: Pancreas is normal. No ductal dilatation. No pancreatic inflammation.  Spleen: Normal spleen.  Adrenals/urinary tract: Adrenal glands are normal. No nephrolithiasis or ureterolithiasis. There bilateral simple fluid attenuation cysts which are not changed from prior.  There is suprapubic catheter in place. The bladder is collapsed. The bladder is anterior and superior to the pubic symphysis.  Stomach/Bowel: Stomach demonstrates a moderate size hiatal hernia. The duodenum and small bowel are normal. There is a rim of gas along the dependent luminal  surface of the cecum and ascending colon. This gas is not clearly evident along the nondependent surface of felt to relate to stool rather than pneumatosis. There is a moderate volume stool in the right colon. Appendix is not identified. The transverse colon is ptotic and extends into the upper pelvis. Moderate volume of stool in the transverse colon. The sigmoid colon is likewise tortuous. No obstructing lesion identified. The rectum is collapsed.  Vascular/Lymphatic: Endovascular repair of the abdominal aortic aneurysm not changed from prior. No retroperitoneal periportal lymphadenopathy.  Reproductive: Prior prostatectomy. No free fluid small amount free fluid the pelvis.  Musculoskeletal: No aggressive osseous lesion.  Other: No abscess or inflammation  IMPRESSION: 1. Suprapubic catheter in place the collapsed bladder which is positioned superior to the pubic symphysis. No complicating features identified. 2. No hydronephrosis or ureterolithiasis. 3. Moderate volume stool in the right colon and transverse colon suggests constipation. 4. Small amount free fluid the pelvis. 5. Endovascular repair of aortic aneurysm without complicating features.   Electronically Signed   By: Suzy Bouchard M.D.   On: 05/04/2014 10:37   Dg Chest Port 1 View  05/04/2014   CLINICAL DATA:  Abdominal pain.  EXAM: PORTABLE CHEST - 1 VIEW  COMPARISON:  04/07/2014  FINDINGS: The patient has severe chronic interstitial and obstructive lung disease.  There is increased accentuation of the interstitial markings at both lung bases suggesting interstitial edema superimposed on chronic disease. No consolidative infiltrates or effusions. Chronic thoracolumbar scoliosis. No acute osseous abnormality.  IMPRESSION: Possible interstitial edema at the lung bases superimposed on severe emphysema and interstitial lung disease.   Electronically Signed   By: Rozetta Nunnery M.D.   On: 05/04/2014 10:05   Dg Abd 2 Views  05/06/2014   CLINICAL DATA:   Low abdomen pain  EXAM: ABDOMEN - 2 VIEW  COMPARISON:  CT abdomen pelvis May 04, 2014  FINDINGS: There is mild prominence of colonic caliber unchanged compared to prior CT. There is no evidence of small bowel obstruction. Aortic stent is noted. Surgical clips are identified in the pelvis. Scoliosis of the spine is noted. There are changes of COPD in the lung bases.  IMPRESSION: No bowel obstruction or free air.   Electronically Signed   By: Abelardo Diesel M.D.   On: 05/06/2014 15:33     Microbiology: Recent Results (from the past 240 hour(s))  Urine culture     Status: None   Collection Time: 05/04/14 10:45 AM  Result Value Ref Range Status   Specimen Description URINE, CATHETERIZED  Final   Special Requests NONE  Final   Culture  Setup Time   Final    05/04/2014 13:03 Performed at  [facility] [employer] Count   Final    >=100,000 COLONIES/ML Performed at [place] Performed at [facility]   Final   Report Status 05/06/2014 FINAL  Final  MRSA PCR Screening     Status: None   Collection Time: 05/06/14  7:52 PM  Result Value Ref Range Status   MRSA by PCR NEGATIVE NEGATIVE Final    Comment:        The GeneXpert MRSA Assay (FDA approved for NASAL specimens only), is one component of a comprehensive MRSA colonization surveillance program. It is not intended to diagnose MRSA infection nor to guide or monitor treatment for MRSA infections.      Labs: Basic Metabolic Panel:  Recent Labs Lab 05/04/14 0905 05/04/14 0937 05/05/14 0327 05/06/14 0339 05/07/14 0433 05/08/14 0343  NA 140 138 138 141 140 142  K 4.7 4.5 4.9 4.5 3.5* 3.4*  CL 102 105 103 109 109 110  CO2 21  --  22 22 20 22   GLUCOSE 134* 129* 111* 106* 93 90  BUN 31* 32* 43* 43* 31* 25*  CREATININE 1.54* 1.70* 1.70* 1.49* 1.30 1.15  CALCIUM 9.5  --  8.3* 8.0* 8.2* 8.3*   Liver Function Tests:  Recent Labs Lab 05/04/14 0905 05/05/14 0327  AST 24 20  ALT  8 7  ALKPHOS 82 61  BILITOT 0.8 0.4  PROT 6.1 5.0*  ALBUMIN 3.1* 2.3*    Recent Labs Lab 05/04/14 0928  LIPASE 21   No results for input(s): AMMONIA in the last 168 hours. CBC:  Recent Labs Lab 05/04/14 0905 05/04/14 0937 05/05/14 0327 05/06/14 0339 05/07/14 0433  WBC 15.4*  --  11.9* 8.8 7.8  NEUTROABS 14.1*  --   --   --   --   HGB 13.5 15.0 12.1* 10.7* 10.9*  HCT 42.3 44.0 37.4* 34.1* 33.9*  MCV 85.3  --  84.2 85.7 85.8  PLT 276  --  218 164 164   Cardiac Enzymes: No results for input(s): CKTOTAL, CKMB, CKMBINDEX, TROPONINI in the last 168 hours. BNP: Invalid input(s): POCBNP CBG: No results for input(s): GLUCAP in the last 168 hours.  Time coordinating discharge:  Greater than 30 minutes  Signed:  Jaise Moser, DO [facility] Pager: [phone] 05/09/2014, 11:02 AM

## 2014-05-08 NOTE — Progress Notes (Signed)
Progress Note from the Palliative Medicine Team at [place]: I had a long conversation with Bill [name] and his son and daughter. They have decided that they wish to go home with hospice instead of SNF. Son will move in with him, they will have hospice, and they will have private in home caregivers. They can consider transition into hospice facility as needed. Family is much happier with this decision after we discussed SNF rehab and hospice facility. They agree he cannot rehab and he may not be quite ready for hospice facility. I believe this is a good option for them and that they have enough resources to be successful at home.    Objective: No Known Allergies Scheduled Meds: . bisacodyl  10 mg Rectal Once  . docusate sodium  100 mg Oral BID  . feeding supplement (ENSURE COMPLETE)  237 mL Oral TID BM  . gabapentin  100 mg Oral TID  . mirabegron ER  50 mg Oral Daily  . mirtazapine  15 mg Oral QHS  . piperacillin-tazobactam (ZOSYN)  IV  3.375 g Intravenous Q8H  . sorbitol  30 mL Oral q morning - 10a  . vancomycin  500 mg Intravenous Q12H   Continuous Infusions: . sodium chloride 50 mL/hr at 05/08/14 0413   PRN Meds:.acetaminophen **OR** acetaminophen, bisacodyl, LORazepam, morphine injection, morphine CONCENTRATE, ondansetron **OR** ondansetron (ZOFRAN) IV  BP 136/62 mmHg  Pulse 80  Temp(Src) 98.6 F (37 C) (Oral)  Resp 18  Ht 5\' 11"  (1.803 m)  Wt 53.4 kg (117 lb 11.6 oz)  BMI 16.43 kg/m2  SpO2 98%   PPS: 20%   Intake/Output Summary (Last 24 hours) at 05/08/14 0948 Last data filed at 05/08/14 0700  Gross per 24 hour  Intake   1735 ml  Output   1300 ml  Net    435 ml      LBM: 05/07/14  Physical Exam:  General: NAD, thin, frail, tenderness over left clavicular/scapular are h/o recent shingles HEENT: Temporal muscle wasting, no JVD, moist mucous membranes Chest: Diminished throughout, rales in bases, labored with conversation at times CVS: RRR, S1  S2 Abdomen: Soft, NT, ND, +BS Ext: MAE, no edema, warm to touch Neuro: Awake, alert, confused in conversation  Labs: CBC    Component Value Date/Time   WBC 7.8 05/07/2014 0433   RBC 3.95* 05/07/2014 0433   HGB 10.9* 05/07/2014 0433   HCT 33.9* 05/07/2014 0433   PLT 164 05/07/2014 0433   MCV 85.8 05/07/2014 0433   MCH 27.6 05/07/2014 0433   MCHC 32.2 05/07/2014 0433   RDW 17.3* 05/07/2014 0433   LYMPHSABS 0.4* 05/04/2014 0905   MONOABS 0.9 05/04/2014 0905   EOSABS 0.0 05/04/2014 0905   BASOSABS 0.0 05/04/2014 0905    BMET    Component Value Date/Time   NA 142 05/08/2014 0343   K 3.4* 05/08/2014 0343   CL 110 05/08/2014 0343   CO2 22 05/08/2014 0343   GLUCOSE 90 05/08/2014 0343   BUN 25* 05/08/2014 0343   CREATININE 1.15 05/08/2014 0343   CALCIUM 8.3* 05/08/2014 0343   GFRNONAA 58* 05/08/2014 0343   GFRAA 67* 05/08/2014 0343    CMP     Component Value Date/Time   NA 142 05/08/2014 0343   K 3.4* 05/08/2014 0343   CL 110 05/08/2014 0343   CO2 22 05/08/2014 0343   GLUCOSE 90 05/08/2014 0343   BUN 25* 05/08/2014 0343   CREATININE 1.15 05/08/2014 0343   CALCIUM 8.3* 05/08/2014  0343   PROT 5.0* 05/05/2014 0327   ALBUMIN 2.3* 05/05/2014 0327   AST 20 05/05/2014 0327   ALT 7 05/05/2014 0327   ALKPHOS 61 05/05/2014 0327   BILITOT 0.4 05/05/2014 0327   GFRNONAA 58* 05/08/2014 0343   GFRAA 67* 05/08/2014 0343    Assessment and Plan: 1. Code Status: DNR 2. Symptom Control: 1. Pain/dyspnea: Roxanol 5 mg every hour prn. Morphine 2 mg IV every 6 hours if roxanol ineffective.  2. Constipation: Colace BID. Sorbitol 70% 30 mL daily. Encourage dulcolax supp but he is refusing currently.  3. Severe protein calorie malnutrition: Continue remeron qhs. Ensure TID.  4. Agitation/Sleep: Lorazepam 0.5 mg BID prn. May increase frequency as needed.  3. Psycho/Social: Emotional support provided to patient and family at bedside.  4. Spiritual: Support from personal pastor.   5. Disposition: Home with hospice and private caregivers.    Time In Time Out Total Time Spent with Patient Total Overall Time  1000 1100 85min 42min    Greater than 50%  of this time was spent counseling and coordinating care related to the above assessment and plan.  Vinie Sill, NP Palliative Medicine Team Pager # [phone] (M-F 8a-5p) Team Phone # [phone] (Nights/Weekends)

## 2014-05-08 NOTE — Progress Notes (Signed)
PROGRESS NOTE  Bill [name] QBV:[id number] DOB: 08/30/1930 DOA: 05/04/2014 PCP: Maryella Shivers, MD  Assessment/Plan: Sepsis -Present at the time of admission -Secondary to HCAP and UTI -Continue empiric vancomycin and Zosyn UTI  -Patient has chronic indwelling suprapubic catheter  -Suprapubic catheter changed 05/05/2014  -Continue empiric antibiotics pending culture data  -urine culture= yeast--likely a colonizer, sample obtained prior to suprapubic catheter change -will not treat yeast as pt is clinically improving/stable without antifungal HCAP/Aspiration pneumonitis -Chest x-ray reveals bibasilar infiltrates which are new when compared to a chest x-ray on 04/07/2014  -Continue empiric antibiotics pending culture data  -Patient was recently discharged from [facility] with PNA -WBC improved -Family wishes to continue antibiotics- Abdominal pain  -Suspect may be due to constipation as CT revealed moderate stool in the colon  -[place] --pt had BM 05/07/14 -2 view abdomen x-ray--neg for free air or obstruction AKI -secondary to sepsis/ATN and volume depletion in setting of vomiting -improving slowly--serum creatinine appears back to baseline today -judicious IVF -d/c lisinopril Goals of Care -discussed concepts of hospice and comfort care with pt and daughter at bedside -pt still has some concerns regarding disposition and comfort care -appreciate palliative medicine consult -plan is to go home with hospice care with focus on comfort--they also have private health agency to also come to help pt postherpetic neuralgia  -Continue Norco and gabapentin  Severe protein calorie malnutrition  -Ensure complete    Family Communication:   Tried to call daughter Carolyn--left message Disposition Plan:   Home 05/09/14       Procedures/Studies: Ct Abdomen Pelvis Wo Contrast  05/04/2014   CLINICAL DATA:  Abdominal pain. Bloody output from  suprapubic catheter.  EXAM: CT ABDOMEN AND PELVIS WITHOUT CONTRAST  TECHNIQUE: Multidetector CT imaging of the abdomen and pelvis was performed following the standard protocol without IV contrast.  COMPARISON:  CT 10/04/2011  FINDINGS: Lower chest: There is extensive emphysema at the lung bases. No change from prior. No pericardial fluid.  Hepatobiliary: No focal hepatic lesion on this non contrast exam. There are several low-density cysts within the liver not changed from prior. No ductal dilatation.  Pancreas: Pancreas is normal. No ductal dilatation. No pancreatic inflammation.  Spleen: Normal spleen.  Adrenals/urinary tract: Adrenal glands are normal. No nephrolithiasis or ureterolithiasis. There bilateral simple fluid attenuation cysts which are not changed from prior.  There is suprapubic catheter in place. The bladder is collapsed. The bladder is anterior and superior to the pubic symphysis.  Stomach/Bowel: Stomach demonstrates a moderate size hiatal hernia. The duodenum and small bowel are normal. There is a rim of gas along the dependent luminal surface of the cecum and ascending colon. This gas is not clearly evident along the nondependent surface of felt to relate to stool rather than pneumatosis. There is a moderate volume stool in the right colon. Appendix is not identified. The transverse colon is ptotic and extends into the upper pelvis. Moderate volume of stool in the transverse colon. The sigmoid colon is likewise tortuous. No obstructing lesion identified. The rectum is collapsed.  Vascular/Lymphatic: Endovascular repair of the abdominal aortic aneurysm not changed from prior. No retroperitoneal periportal lymphadenopathy.  Reproductive: Prior prostatectomy. No free fluid small amount free fluid the pelvis.  Musculoskeletal: No aggressive osseous lesion.  Other: No abscess or inflammation  IMPRESSION: 1. Suprapubic catheter in place the collapsed bladder which is positioned superior to the pubic  symphysis. No complicating features identified. 2. No hydronephrosis  or ureterolithiasis. 3. Moderate volume stool in the right colon and transverse colon suggests constipation. 4. Small amount free fluid the pelvis. 5. Endovascular repair of aortic aneurysm without complicating features.   Electronically Signed   By: Suzy Bouchard M.D.   On: 05/04/2014 10:37   Dg Chest Port 1 View  05/04/2014   CLINICAL DATA:  Abdominal pain.  EXAM: PORTABLE CHEST - 1 VIEW  COMPARISON:  04/07/2014  FINDINGS: The patient has severe chronic interstitial and obstructive lung disease.  There is increased accentuation of the interstitial markings at both lung bases suggesting interstitial edema superimposed on chronic disease. No consolidative infiltrates or effusions. Chronic thoracolumbar scoliosis. No acute osseous abnormality.  IMPRESSION: Possible interstitial edema at the lung bases superimposed on severe emphysema and interstitial lung disease.   Electronically Signed   By: Rozetta Nunnery M.D.   On: 05/04/2014 10:05   Dg Abd 2 Views  05/06/2014   CLINICAL DATA:  Low abdomen pain  EXAM: ABDOMEN - 2 VIEW  COMPARISON:  CT abdomen pelvis May 04, 2014  FINDINGS: There is mild prominence of colonic caliber unchanged compared to prior CT. There is no evidence of small bowel obstruction. Aortic stent is noted. Surgical clips are identified in the pelvis. Scoliosis of the spine is noted. There are changes of COPD in the lung bases.  IMPRESSION: No bowel obstruction or free air.   Electronically Signed   By: Abelardo Diesel M.D.   On: 05/06/2014 15:33         Subjective: Patient continued to complain of intermittent abdominal pain. He is tolerating his diet. There is no vomiting. He had bowel movement yesterday. He is passing flatus. He denies any chest pain, worsening shortness breath, nausea, vomiting, diarrhea.  Objective: Filed Vitals:   05/07/14 2252 05/08/14 0619 05/08/14 0700 05/08/14 1300  BP: 136/59 142/72  136/62 143/83  Pulse: 87 87 80 95  Temp: 98.2 F (36.8 C) 98.4 F (36.9 C) 98.6 F (37 C) 97.9 F (36.6 C)  TempSrc: Oral Oral Oral Oral  Resp: 18 18 18 19   Height:      Weight:      SpO2: 100% 94% 98% 97%    Intake/Output Summary (Last 24 hours) at 05/08/14 1738 Last data filed at 05/08/14 1035  Gross per 24 hour  Intake    865 ml  Output   1425 ml  Net   -560 ml   Weight change:  Exam:   General:  Pt is alert, follows commands appropriately, not in acute distress  HEENT: No icterus, No thrush,  [place]/AT  Cardiovascular: RRR, S1/S2, no rubs, no gallops  Respiratory: Bibasilar rales. No wheezing. Good air movement.  Abdomen: Soft/+BS, non tender, non distended, no guarding  Extremities: No edema, No lymphangitis, No petechiae, No rashes, no synovitis  Data Reviewed: Basic Metabolic Panel:  Recent Labs Lab 05/04/14 0905 05/04/14 0937 05/05/14 0327 05/06/14 0339 05/07/14 0433 05/08/14 0343  NA 140 138 138 141 140 142  K 4.7 4.5 4.9 4.5 3.5* 3.4*  CL 102 105 103 109 109 110  CO2 21  --  22 22 20 22   GLUCOSE 134* 129* 111* 106* 93 90  BUN 31* 32* 43* 43* 31* 25*  CREATININE 1.54* 1.70* 1.70* 1.49* 1.30 1.15  CALCIUM 9.5  --  8.3* 8.0* 8.2* 8.3*   Liver Function Tests:  Recent Labs Lab 05/04/14 0905 05/05/14 0327  AST 24 20  ALT 8 7  ALKPHOS 82 61  BILITOT 0.8  0.4  PROT 6.1 5.0*  ALBUMIN 3.1* 2.3*    Recent Labs Lab 05/04/14 0928  LIPASE 21   No results for input(s): AMMONIA in the last 168 hours. CBC:  Recent Labs Lab 05/04/14 0905 05/04/14 0937 05/05/14 0327 05/06/14 0339 05/07/14 0433  WBC 15.4*  --  11.9* 8.8 7.8  NEUTROABS 14.1*  --   --   --   --   HGB 13.5 15.0 12.1* 10.7* 10.9*  HCT 42.3 44.0 37.4* 34.1* 33.9*  MCV 85.3  --  84.2 85.7 85.8  PLT 276  --  218 164 164   Cardiac Enzymes: No results for input(s): CKTOTAL, CKMB, CKMBINDEX, TROPONINI in the last 168 hours. BNP: Invalid input(s): POCBNP CBG: No results for  input(s): GLUCAP in the last 168 hours.  Recent Results (from the past 240 hour(s))  Urine culture     Status: None   Collection Time: 05/04/14 10:45 AM  Result Value Ref Range Status   Specimen Description URINE, CATHETERIZED  Final   Special Requests NONE  Final   Culture  Setup Time   Final    05/04/2014 13:03 Performed at [place]   Final    >=100,000 COLONIES/ML Performed at [place] Performed at [facility]   Final   Report Status 05/06/2014 FINAL  Final  MRSA PCR Screening     Status: None   Collection Time: 05/06/14  7:52 PM  Result Value Ref Range Status   MRSA by PCR NEGATIVE NEGATIVE Final    Comment:        The GeneXpert MRSA Assay (FDA approved for NASAL specimens only), is one component of a comprehensive MRSA colonization surveillance program. It is not intended to diagnose MRSA infection nor to guide or monitor treatment for MRSA infections.      Scheduled Meds: . bisacodyl  10 mg Rectal Once  . docusate sodium  100 mg Oral BID  . feeding supplement (ENSURE COMPLETE)  237 mL Oral TID BM  . gabapentin  100 mg Oral TID  . mirabegron ER  50 mg Oral Daily  . mirtazapine  15 mg Oral QHS  . piperacillin-tazobactam (ZOSYN)  IV  3.375 g Intravenous Q8H  . sorbitol  30 mL Oral q morning - 10a  . vancomycin  500 mg Intravenous Q12H   Continuous Infusions: . sodium chloride 50 mL/hr at 05/08/14 0413     Amarie Tarte, DO  [facility] Pager [phone]  If 7PM-7AM, please contact night-coverage [email or web address] Password [email or web address] 05/08/2014, 5:38 PM   LOS: 4 days

## 2014-05-09 MED ORDER — LEVOFLOXACIN 750 MG PO TABS
750.0000 mg | ORAL_TABLET | ORAL | Status: DC
  Administered 2014-05-09: 750 mg via ORAL
  Filled 2014-05-09: qty 1

## 2014-05-09 NOTE — Clinical Social Work Note (Signed)
  CSW met with pt at bedside to arrange for transportation home  CSW confirmed pt address and let him know that transportation would be called for him to discharge home  CSW spoke with RN who stated that she would call pt's daughter and also coordinate with hospice agency that will be going into pt's home  CSW prepared discharge paperwork and gave to RN  CSW called for transport  No further CSW needs  CSW signing off  .Dede Query, LCSW [facility] Clinical Social Worker - Weekend Coverage cell #: [phone]

## 2014-05-09 NOTE — Progress Notes (Signed)
CARE MANAGEMENT NOTE 05/09/2014  Patient:  Bill [name], Bill [name]   Account Number:  [id number]  Date Initiated:  05/05/2014  Documentation initiated by:  DAVIS,RHONDA  Subjective/Objective Assessment:   pt with poss sepsis and hypotension     Action/Plan:   home when stable   Anticipated DC Date:  05/08/2014   Anticipated DC Plan:  HOME W HOSPICE CARE  In-house referral  Clinical Social Worker      DC Forensic scientist  CM consult      PAC Choice  HOSPICE   Choice offered to / List presented to:  C-4 Adult Children   DME arranged  NA      DME agency  NA     [place] arranged  HH-1 RN      [place] agency  [facility]   Status of service:  Completed, signed off Medicare Important Message given?  YES (If response is "NO", the following Medicare IM given date fields will be blank) Date Medicare IM given:  05/08/2014 Medicare IM given by:  Bill [name] Date Additional Medicare IM given:   Additional Medicare IM given by:    Discharge Disposition:  [place]  Per UR Regulation:  Reviewed for med. necessity/level of care/duration of stay  If discussed at [place] of Stay Meetings, dates discussed:    Comments:  05/09/2014 1200 NCM spoke to dtr and hospital bed and oxygen is scheduled for delivery at 1200 noon. She will call Unit RN to make aware when they receive DME. Faxed dc summary to [facility]. Spoke to [facility], [place]. They are scheduled to come out to this afternoon to admit to Hospice Service. On call RN requested dc summary come with pt home. Will send with dc package. CSW called for dc home via [facility]. Bill Finner RN Case Mgmt phone [phone]   05/08/14 Bill Shell RN BSN CM [phone] Spoke with patient and daughter Bill [name] and they stated that after consulting with Palliative Care they have decided to go home with hospice. Choice offered and daughter stated they want to use [facility]. She stated that  they have a private duty agency, Bill [name], that will come in throughout the week and the patient's son will move in with the patient temporarily for support. This CM spoke with Juliann Pulse at [place] and provided the referral information and faxed requested forms, and provided Carolyn's contact information. Juliann Pulse stated that she will follow up with Bill [name] to find out what DME is needed in the home and have that delivered. This CM spoke with Dr. Carles Collet and the patient is tentatively scheduled for dc 12/12, therefore Juliann Pulse will have DME in home prior to discharge. Will need discharge summary faxed to [place] at [phone] when prepared and call to oncall RN at [phone] prior to discharge. Confirmed with Juliann Pulse at [facility] at [place] that DME has been ordered.  05/07/14 Bill Shell RN BSN CM [phone] ICU to [place] transfer 05/06/14, spoke with patient and daughter, Bill [name], at bedside and they stated that the patient has a [facility] aide that is private pay 5 days/week for 3 hours each day. They stated that they have requested a Palliative Care consult and the daughter was requesting fromation regarding [facility] in [place], informed that SW assists with facility placement and communication and referral would be made to SW. Will continue to follow  [id number]/Rhonda Davis,RN,BSN,CCM:

## 2014-05-29 DEATH — deceased
